# Patient Record
Sex: Female | Born: 1935 | Race: White | Hispanic: No | Marital: Single | Smoking: Never smoker
Health system: Southern US, Community
[De-identification: ages and names within clinical notes are randomized; demographics above are authoritative.]

## PROBLEM LIST (undated history)

## (undated) DIAGNOSIS — F0390 Unspecified dementia without behavioral disturbance: Secondary | ICD-10-CM

## (undated) DIAGNOSIS — C801 Malignant (primary) neoplasm, unspecified: Secondary | ICD-10-CM

## (undated) DIAGNOSIS — E119 Type 2 diabetes mellitus without complications: Secondary | ICD-10-CM

## (undated) DIAGNOSIS — I10 Essential (primary) hypertension: Secondary | ICD-10-CM

## (undated) HISTORY — PX: ABDOMINAL HYSTERECTOMY: SHX81

## (undated) HISTORY — DX: Unspecified dementia, unspecified severity, without behavioral disturbance, psychotic disturbance, mood disturbance, and anxiety: F03.90

---

## 2000-03-14 ENCOUNTER — Inpatient Hospital Stay (HOSPITAL_COMMUNITY): Admission: EM | Admit: 2000-03-14 | Discharge: 2000-03-17 | Payer: Self-pay | Admitting: Internal Medicine

## 2000-03-14 ENCOUNTER — Encounter: Payer: Self-pay | Admitting: Emergency Medicine

## 2000-03-23 ENCOUNTER — Emergency Department (HOSPITAL_COMMUNITY): Admission: EM | Admit: 2000-03-23 | Discharge: 2000-03-23 | Payer: Self-pay | Admitting: Emergency Medicine

## 2001-02-05 ENCOUNTER — Encounter: Admission: RE | Admit: 2001-02-05 | Discharge: 2001-02-05 | Payer: Self-pay | Admitting: Family Medicine

## 2001-02-05 ENCOUNTER — Encounter: Payer: Self-pay | Admitting: Family Medicine

## 2001-02-05 ENCOUNTER — Emergency Department (HOSPITAL_COMMUNITY): Admission: EM | Admit: 2001-02-05 | Discharge: 2001-02-05 | Payer: Self-pay | Admitting: Emergency Medicine

## 2003-06-09 ENCOUNTER — Encounter: Payer: Self-pay | Admitting: Family Medicine

## 2003-06-09 ENCOUNTER — Encounter: Admission: RE | Admit: 2003-06-09 | Discharge: 2003-06-09 | Payer: Self-pay | Admitting: Family Medicine

## 2004-07-19 ENCOUNTER — Ambulatory Visit (HOSPITAL_COMMUNITY): Admission: RE | Admit: 2004-07-19 | Discharge: 2004-07-19 | Payer: Self-pay | Admitting: Family Medicine

## 2005-05-16 ENCOUNTER — Emergency Department (HOSPITAL_COMMUNITY): Admission: EM | Admit: 2005-05-16 | Discharge: 2005-05-16 | Payer: Self-pay | Admitting: Emergency Medicine

## 2009-03-24 ENCOUNTER — Emergency Department (HOSPITAL_COMMUNITY): Admission: EM | Admit: 2009-03-24 | Discharge: 2009-03-25 | Payer: Self-pay | Admitting: Emergency Medicine

## 2009-11-09 ENCOUNTER — Observation Stay (HOSPITAL_COMMUNITY): Admission: EM | Admit: 2009-11-09 | Discharge: 2009-11-09 | Payer: Self-pay | Admitting: Emergency Medicine

## 2010-02-27 ENCOUNTER — Emergency Department (HOSPITAL_COMMUNITY): Admission: EM | Admit: 2010-02-27 | Discharge: 2010-02-27 | Payer: Self-pay | Admitting: Emergency Medicine

## 2011-01-05 LAB — GLUCOSE, CAPILLARY: Glucose-Capillary: 87 mg/dL (ref 70–99)

## 2011-01-05 LAB — POCT I-STAT, CHEM 8
BUN: 36 mg/dL — ABNORMAL HIGH (ref 6–23)
Chloride: 114 mEq/L — ABNORMAL HIGH (ref 96–112)
Creatinine, Ser: 1.5 mg/dL — ABNORMAL HIGH (ref 0.4–1.2)
Potassium: 4 mEq/L (ref 3.5–5.1)
Sodium: 144 mEq/L (ref 135–145)

## 2011-01-07 LAB — URINE CULTURE

## 2011-01-07 LAB — URINALYSIS, ROUTINE W REFLEX MICROSCOPIC
Glucose, UA: NEGATIVE mg/dL
Nitrite: NEGATIVE
Specific Gravity, Urine: 1.017 (ref 1.005–1.030)
pH: 5 (ref 5.0–8.0)

## 2011-01-07 LAB — CBC
HCT: 28.8 % — ABNORMAL LOW (ref 36.0–46.0)
Hemoglobin: 9.7 g/dL — ABNORMAL LOW (ref 12.0–15.0)
Platelets: 188 10*3/uL (ref 150–400)
RDW: 16 % — ABNORMAL HIGH (ref 11.5–15.5)
WBC: 6.3 10*3/uL (ref 4.0–10.5)

## 2011-01-07 LAB — DIFFERENTIAL
Basophils Absolute: 0 10*3/uL (ref 0.0–0.1)
Basophils Relative: 1 % (ref 0–1)
Eosinophils Absolute: 0.1 10*3/uL (ref 0.0–0.7)
Eosinophils Relative: 1 % (ref 0–5)
Monocytes Absolute: 0.4 10*3/uL (ref 0.1–1.0)
Monocytes Relative: 6 % (ref 3–12)
Neutro Abs: 3.6 10*3/uL (ref 1.7–7.7)

## 2011-01-07 LAB — COMPREHENSIVE METABOLIC PANEL
ALT: 11 U/L (ref 0–35)
Albumin: 3.7 g/dL (ref 3.5–5.2)
Alkaline Phosphatase: 45 U/L (ref 39–117)
BUN: 47 mg/dL — ABNORMAL HIGH (ref 6–23)
Chloride: 105 mEq/L (ref 96–112)
Glucose, Bld: 79 mg/dL (ref 70–99)
Potassium: 4.5 mEq/L (ref 3.5–5.1)
Sodium: 136 mEq/L (ref 135–145)
Total Bilirubin: 0.9 mg/dL (ref 0.3–1.2)
Total Protein: 7.5 g/dL (ref 6.0–8.3)

## 2011-01-07 LAB — URINE MICROSCOPIC-ADD ON

## 2011-03-07 NOTE — H&P (Signed)
[place]. [facility]  Patient:    Tricia Dean, Tricia Dean                      MRN: [id number] Adm. Date:  [id number] Attending:  Redmond Baseman             [facility], D. Clovis Riley, M.D.                         History and Physical  PRIMARY CARE PHYSICIAN:  D. Clovis Riley, M.D. at [facility].  CHIEF COMPLAINT:  A 75 year old white female who comes in because of fever, nausea and vomiting, diarrhea and weakness.  HISTORY OF PRESENT ILLNESS:  A 75 year old white female who recently had new medications for diabetes started two weeks ago, then one week ago started with nausea, vomiting, a poor appetite, subsequent diarrhea one week ago, today started with chills and fever.  Temperature just under 104.  She had some mild abdominal pain yesterday earlier, but she does not have her appendix in.  No urinary symptoms, no blood in the stool, no blood in the urine, no blood in the vomitus, no jaundice, no abdominal pain at present.  She was evaluated in the emergency room and found to be dehydrated, hypokalemic, sugar under control and necessitated admission with an elevated white cell count and possible urosepsis.  PAST MEDICAL HISTORY: 1. DM, type 2, for over 10+ years. 2. Hypertension for25+ years. 3. Postmenopausal.  PAST SURGICAL HISTORY:  Hysterectomy, appendectomy remotely.  ALLERGIES:  PENICILLIN and MYCINs.  MEDICATIONS: 1. Tricor 200 mg (discontinued three days ago). 2. Accupril 20 mg q.d. 3. Glyburide 5 mg b.i.d. 4. Actos 30 mg q.d. (which was recently started two weeks ago). 5. Glucophage 850 mg b.i.d.  DIET:  She is to avoid sweets.  She is not on a specific diet.  SOCIAL HISTORY:  Does not smoke, drink.  She is unemployed at present.  She use to work for [employer].  She has two children.  She is married 36+ years.  Her husband was here, one of her daughters.  FAMILY HISTORY:  Mom died at 76 with multiple problems:   asthma, diverticulitis.  Dad died at 70+ years of emphysema.  REVIEW OF SYSTEMS:  She has generalized weakness, no better since the IV started.  She was given a bolus of 10 mEq of KCl, and IV normal saline was started.  Respiratory system:  Negative.  CVS:  Negative.  GI tract:  As above.  GU:  Negative.  CNS:  Generalized weakness.  PHYSICAL EXAMINATION:  GENERAL APPEARANCE:  A white female in no acute distress.  VITAL SIGNS:  Temperature 100.9, blood pressure initially was 109/57 with a heart rate of 114, respiratory rate 22 per minute.  Normal blood pressure was 127/73, heart rate now 80, respiratory rate now 18 per minute when I have seen her, after she has received some IV fluids and potassium.  HEENT:  Anicteric, acyanotic.  Mucous membranes pink and dry.  Pupils are equally reactive to light.  Eyes:  Clear.  Nose:  Clear.  Ears:  Clear.  NECK:  Supple.  LUNGS:  Clear.  CVS:  S1, S2 regular, no murmurs.  ABDOMEN:  Soft, benign.  No liver, kidney, spleen felt.  Bowel sounds normally heard.  PELVIC/RECTAL:  Deferred.  CNS:  Negative, nonfocal.  EXTREMITIES:  No edema.  NEUROLOGIC:  She was awake, alert and conversant.  LABORATORY DATA:  Chest x-ray:  No acute cardiopulmonary disease, question of a minimal left pleural effusion.  EKG:  Normal sinus rhythm with a rate of 93 per minute was normal.  Normal WBC was 17.5 with a left shift, hemoglobin of 10.7, hematocrit 29.9, platelets 355.  Urinalysis:  Protein, positive leukocytes, large bacteria, many glucose, 250 mg/dL, hemoglobin small.  Specific gravity 1.021, pH 5.5.  CMET:  Sodium 131, potassium 2.8, chloride 96, CO2 24, glucose 301, BUN 29, creatinine 1.1, albumin was low at 2.8.  The rest of her liver enzymes normal.  IMPRESSION: 1.  Urosepsis. 2.  Gastroenteritis. 3.  Dehydration. 4.  Controlled diabetes mellitus. 5.  Hypertension. 6.  Postmenopausal. 7.  Hypertriglyceridemia. 8.  Anemia.  PLAN:  1.  Admit.  2. Condition:  Fair.  3. Activities as tolerated.  4. Diet: An 1800 2 g ADA diet.  5. Vital signs q.routine.  6. Diabetic teaching.  7. Nutrition consult.  8. Cipro 400 mg IV q.12h.  9. Accupril 20 mg p.o. q.d. 10. Glucophage.  Switch her diabetic medication to Glucagon 5/500 mg     one p.o. b.i.d. 11. CBGs. 12. Sliding scale Regular Insulin to cover her. 13. IV fluid rehydration normal saline, first L 250 cc per hour then     change to IV D-5 1/2 normal plus 20 mEq of KCl (potassium chloride)     per L at 150 cc an hour. 14. Total three runs of potassium chloride 10 mEq IV boluses. 15. Check her potassium level in four hours.  In the case of less     than 3.0, will give her three more runs of boluses of potassium     chloride. 16. Urine for C&S if not sent. 17. Is and Os. 18. Daily weights. 19. Phenergan IV 12.5 mg q.6h. for nausea and vomiting. 20. Stools, O&P, C&S. 21. Anemia workup. 21. Adjustment to treatment, will amend as necessary. DD:  03/14/00 TD:  03/14/00 Job: [id number]

## 2011-03-07 NOTE — Consult Note (Signed)
[facility]  Patient:    Tricia Dean, Tricia Dean                      MRN: [id number] Adm. Date:  [id number] Disc. Date: [id number] Attending:  Devoria Albe                          Consultation Report  DATE OF BIRTH:  1936-05-01.  HISTORY OF PRESENT ILLNESS:  I had the pleasant to see the patient in the emergency room.  She was kindly referred by Dr. Abran Cantor. Clovis Riley at [facility].  The patient is a 75 years old and she fell down two steps today, no loss of consciousness and complains of right lower extremity pain about her ankle and foot.  She had x-rays at [facility] which show fractures about the lateral malleolus and fifth metatarsal.  The patient denies numbness or tingling in her foot.  She does not have signs or symptoms of compartment syndrome.  She denies pelvis pain, opposite extremity pain or upper extremity pain.  PAST MEDICAL HISTORY:  Diabetes mellitus, controlled with oral hypoglycemics, hypertension, hypercholesterolemia.  PAST SURGICAL HISTORY:  Hysterectomy.  CURRENT MEDICINES:  Three different hypoglycemics, hypertensive medication and aspirin a day.  ALLERGIES:  PENICILLIN and MYCINS.  SOCIAL HISTORY:  No tobacco or ethanol use is noted.  She lives with her husband.  PHYSICAL EXAMINATION:  GENERAL:  Physical examination reveals a white female, alert and oriented, in no acute distress.  EXTREMITIES:  Patient has soft tissue swelling about the right ankle.  She is tender over the lateral malleolus and fifth metatarsal.  She has normal dorsalis pedis pulse, L4 through S1 sensation is intact.  Medial malleolus is minimally tender but no excessive swelling is noted.  There are no signs or symptoms of DVT, compartment syndrome or laceration or abrasion.  Calf is soft.  Knee is stable.  Hips are nontender.  Left lower extremity is atraumatic.  Pelvis is stable.  Her upper extremity examination is benign.  NECK AND BACK:   Nontender.  X-RAY FINDINGS:  X-rays are reviewed which show a displaced Weber A distal fibula fracture.  This does have some cortical apposition noted.  There is no medial malleolus widening or obvious medial malleolus fracture.  The patient has a metaphyseal fifth metatarsal fracture as well but there is no obvious lateral process with a talus fracture.  The Lisfranc joints are intact.  IMPRESSION: 1. Closed lateral malleolus/fibular fracture in a Weber A distribution. 2. Closed metaphyseal fifth metatarsal fracture about the foot.  PLAN:  I have gone ahead and placed her in a nonweightbearing splint, walker was dispensed and I have discussed with the patient, elevation, toe movement, icing and return to clinic in 8 to 10 days for followup.  We will try and treat this non-operatively, given the Weber A distribution and intact mortise. We will monitor this radiographically.  She was given a prescription for Vicodin and Robaxin to be taken as directed and will notify us should any problems, questions or concerns arise.  I have asked her to continue her aspirin each day as instructed.  All questions have been encouraged and answered. DD:  02/05/01 TD:  02/08/01 Job: [id number]

## 2011-03-07 NOTE — Discharge Summary (Signed)
[place]. [facility]  Patient:    Tricia Dean, CASHIN                      MRN: [id number] Adm. Date:  [id number] Disc. Date: [id number] Attending:  Otilio Connors Iv CC:         Dr. Lupe Carney   Discharge Summary  CONSULTATIONS:  None.  PROCEDURES:  None.  HISTORY OF PRESENT ILLNESS:  Patient is a 75 year old type 2 diabetic who one week prior to admission developed nausea, vomiting, anorexia, diarrhea, followed by fever and chills.  Mild abdominal pain was self-limited and she described no urinary symptoms, no melena, and no back pain.  Evaluation in the emergency room revealed dehydration, hypokalemia, and leukocytosis with question of urosepsis and Dr. Leodis Sias has requested we admit patient. For further details of her presentation, please see his dictated report.  HOSPITAL COURSE:  [name] was placed on IV fluids, empiric antibiotics, and blood and urine cultures were submitted.  Blood cultures remained negative, but urine culture showed greater than 100,000 colonies of E. coli sensitive to chosen antibiotics.  Blood sugars were controlled with sliding scale and calorie restrictions.  Her symptoms rapidly improved.  Antibiotic was changed to oral Levaquin, fever resolved, leukocytosis normalized, and appetite returned.  She was discharged in stable condition on Mar 17, 2000 to follow up with her primary care physician.  DISCHARGE MEDICATIONS: 1. Glucophage 850 mg p.o. b.i.d. 2. Glyburide 5 mg p.o. b.i.d. 3. Levaquin 250 mg p.o. q.d. 4. Iron tablets 325 mg p.o. b.i.d. 5. Actos 30 mg p.o. q.d. 6. Accupril 20 mg p.o. q.d.  FOLLOW-UP:  Discharge follow up arranged with Dr. Clovis Riley within one week.  DISCHARGE DIAGNOSES: 1. Pyelonephritis, Escherichia coli with septicemia. 2. Dehydration. 3. Mild prerenal azotemia secondary to the above. 4. Anemia of mixed etiology including iron-deficiency and B12 deficiency    as well as decreased  total iron binding capacity suggesting chronic disease    components.  Discharge hemoglobin 9.3 with mean corpuscular volume reading    2.2, B12 of 219, total iron 16, total iron binding capacity 240, percent    saturation 7. 5. Diabetes mellitus times greater than 10 years. 6. Hypertension times greater than 25 years. 7. Dyslipidemia. 8. Allergy reported to PENICILLIN and MACROLIDES. DD:  07/21/00 TD:  07/21/00 Job: [id number]

## 2012-10-22 ENCOUNTER — Emergency Department (HOSPITAL_COMMUNITY): Payer: Medicare Other

## 2012-10-22 ENCOUNTER — Encounter (HOSPITAL_COMMUNITY): Payer: Self-pay | Admitting: *Deleted

## 2012-10-22 ENCOUNTER — Observation Stay (HOSPITAL_COMMUNITY)
Admission: EM | Admit: 2012-10-22 | Discharge: 2012-10-24 | Disposition: A | Discharge status: Home / Self Care | Payer: Medicare Other | Attending: Internal Medicine | Admitting: Internal Medicine

## 2012-10-22 DIAGNOSIS — R55 Syncope and collapse: Principal | ICD-10-CM | POA: Diagnosis present

## 2012-10-22 DIAGNOSIS — E119 Type 2 diabetes mellitus without complications: Secondary | ICD-10-CM

## 2012-10-22 DIAGNOSIS — E860 Dehydration: Secondary | ICD-10-CM | POA: Insufficient documentation

## 2012-10-22 DIAGNOSIS — N289 Disorder of kidney and ureter, unspecified: Secondary | ICD-10-CM

## 2012-10-22 DIAGNOSIS — R112 Nausea with vomiting, unspecified: Secondary | ICD-10-CM | POA: Insufficient documentation

## 2012-10-22 DIAGNOSIS — N179 Acute kidney failure, unspecified: Secondary | ICD-10-CM | POA: Insufficient documentation

## 2012-10-22 DIAGNOSIS — I10 Essential (primary) hypertension: Secondary | ICD-10-CM

## 2012-10-22 DIAGNOSIS — R404 Transient alteration of awareness: Secondary | ICD-10-CM | POA: Insufficient documentation

## 2012-10-22 HISTORY — DX: Essential (primary) hypertension: I10

## 2012-10-22 HISTORY — DX: Type 2 diabetes mellitus without complications: E11.9

## 2012-10-22 LAB — CBC WITH DIFFERENTIAL/PLATELET
Basophils Absolute: 0 10*3/uL (ref 0.0–0.1)
Basophils Relative: 0 % (ref 0–1)
Eosinophils Absolute: 0 10*3/uL (ref 0.0–0.7)
Hemoglobin: 10.4 g/dL — ABNORMAL LOW (ref 12.0–15.0)
MCH: 28.7 pg (ref 26.0–34.0)
MCHC: 32.8 g/dL (ref 30.0–36.0)
Monocytes Relative: 5 % (ref 3–12)
Neutro Abs: 5.1 10*3/uL (ref 1.7–7.7)
Neutrophils Relative %: 75 % (ref 43–77)
Platelets: 231 10*3/uL (ref 150–400)
RDW: 12.7 % (ref 11.5–15.5)

## 2012-10-22 MED ORDER — ONDANSETRON HCL 4 MG/2ML IJ SOLN
4.0000 mg | Freq: Once | INTRAMUSCULAR | Status: AC
  Administered 2012-10-22: 4 mg via INTRAVENOUS
  Filled 2012-10-22: qty 2

## 2012-10-22 MED ORDER — SODIUM CHLORIDE 0.9 % IV SOLN
Freq: Once | INTRAVENOUS | Status: AC
  Administered 2012-10-22: 23:00:00 via INTRAVENOUS

## 2012-10-22 NOTE — ED Notes (Addendum)
Per family pt has been having nausea and vomiting for 7 days.  Pt has been taking bactrim and another antibiotic for a abscess on her chin and upper lip.  Family reports that pt was seated at home and started having a "shaking spell" and the had a minute long loss of consciousness.  EMS gave 4 mg of Zofran that seemed to help initially, but pt vomited upon being moved from ambulance to hospital.  Per EMS pt orthostatics as follows:  Sitting 130/94 HR 72, Standing 124/92 HR 72

## 2012-10-23 ENCOUNTER — Encounter (HOSPITAL_COMMUNITY): Payer: Self-pay | Admitting: Family Medicine

## 2012-10-23 DIAGNOSIS — E860 Dehydration: Secondary | ICD-10-CM

## 2012-10-23 DIAGNOSIS — E119 Type 2 diabetes mellitus without complications: Secondary | ICD-10-CM

## 2012-10-23 DIAGNOSIS — R55 Syncope and collapse: Secondary | ICD-10-CM | POA: Diagnosis present

## 2012-10-23 DIAGNOSIS — I10 Essential (primary) hypertension: Secondary | ICD-10-CM

## 2012-10-23 DIAGNOSIS — R6889 Other general symptoms and signs: Secondary | ICD-10-CM

## 2012-10-23 LAB — CBC
MCV: 87.9 fL (ref 78.0–100.0)
Platelets: 227 10*3/uL (ref 150–400)
Platelets: 241 10*3/uL (ref 150–400)
RBC: 3.46 MIL/uL — ABNORMAL LOW (ref 3.87–5.11)
RBC: 3.72 MIL/uL — ABNORMAL LOW (ref 3.87–5.11)
RDW: 12.9 % (ref 11.5–15.5)
RDW: 12.8 % (ref 11.5–15.5)
WBC: 7.5 10*3/uL (ref 4.0–10.5)
WBC: 8.3 10*3/uL (ref 4.0–10.5)

## 2012-10-23 LAB — URINALYSIS, ROUTINE W REFLEX MICROSCOPIC
Leukocytes, UA: NEGATIVE
Nitrite: NEGATIVE
Protein, ur: NEGATIVE mg/dL
Specific Gravity, Urine: 1.019 (ref 1.005–1.030)
Urobilinogen, UA: 1 mg/dL (ref 0.0–1.0)

## 2012-10-23 LAB — COMPREHENSIVE METABOLIC PANEL
AST: 17 U/L (ref 0–37)
Albumin: 3.4 g/dL — ABNORMAL LOW (ref 3.5–5.2)
Alkaline Phosphatase: 56 U/L (ref 39–117)
BUN: 53 mg/dL — ABNORMAL HIGH (ref 6–23)
Chloride: 100 mEq/L (ref 96–112)
Potassium: 3.5 mEq/L (ref 3.5–5.1)
Sodium: 137 mEq/L (ref 135–145)
Total Bilirubin: 0.3 mg/dL (ref 0.3–1.2)
Total Protein: 7.2 g/dL (ref 6.0–8.3)

## 2012-10-23 LAB — BASIC METABOLIC PANEL
CO2: 22 mEq/L (ref 19–32)
Calcium: 9.2 mg/dL (ref 8.4–10.5)
Creatinine, Ser: 2.27 mg/dL — ABNORMAL HIGH (ref 0.50–1.10)
GFR calc Af Amer: 23 mL/min — ABNORMAL LOW (ref >90)
GFR calc non Af Amer: 20 mL/min — ABNORMAL LOW (ref >90)
Sodium: 141 mEq/L (ref 135–145)

## 2012-10-23 LAB — CREATININE, SERUM
Creatinine, Ser: 2.31 mg/dL — ABNORMAL HIGH (ref 0.50–1.10)
GFR calc Af Amer: 23 mL/min — ABNORMAL LOW (ref >90)

## 2012-10-23 LAB — GLUCOSE, CAPILLARY: Glucose-Capillary: 84 mg/dL (ref 70–99)

## 2012-10-23 LAB — LIPASE, BLOOD: Lipase: 16 U/L (ref 11–59)

## 2012-10-23 LAB — MAGNESIUM: Magnesium: 1.7 mg/dL (ref 1.5–2.5)

## 2012-10-23 MED ORDER — ONDANSETRON HCL 4 MG/2ML IJ SOLN
4.0000 mg | Freq: Four times a day (QID) | INTRAMUSCULAR | Status: DC | PRN
  Administered 2012-10-23: 4 mg via INTRAVENOUS
  Filled 2012-10-23: qty 2

## 2012-10-23 MED ORDER — CLONIDINE HCL 0.1 MG PO TABS
0.1000 mg | ORAL_TABLET | Freq: Four times a day (QID) | ORAL | Status: DC | PRN
  Filled 2012-10-23: qty 1

## 2012-10-23 MED ORDER — ALUM & MAG HYDROXIDE-SIMETH 200-200-20 MG/5ML PO SUSP: 30.0000 mL | Freq: Four times a day (QID) | ORAL | Status: DC | PRN

## 2012-10-23 MED ORDER — SODIUM CHLORIDE 0.9 % IV SOLN
INTRAVENOUS | Status: DC
  Administered 2012-10-23 – 2012-10-24 (×3): via INTRAVENOUS

## 2012-10-23 MED ORDER — VANCOMYCIN HCL 1000 MG IV SOLR
750.0000 mg | INTRAVENOUS | Status: DC
  Administered 2012-10-23: 750 mg via INTRAVENOUS
  Filled 2012-10-23 (×2): qty 750

## 2012-10-23 MED ORDER — HEPARIN SODIUM (PORCINE) 5000 UNIT/ML IJ SOLN
5000.0000 [IU] | Freq: Three times a day (TID) | INTRAMUSCULAR | Status: DC
  Administered 2012-10-23 – 2012-10-24 (×4): 5000 [IU] via SUBCUTANEOUS
  Filled 2012-10-23 (×7): qty 1

## 2012-10-23 MED ORDER — INSULIN ASPART 100 UNIT/ML ~~LOC~~ SOLN
0.0000 [IU] | Freq: Three times a day (TID) | SUBCUTANEOUS | Status: DC
  Administered 2012-10-24: 1 [IU] via SUBCUTANEOUS

## 2012-10-23 MED ORDER — DONEPEZIL HCL 10 MG PO TABS
10.0000 mg | ORAL_TABLET | Freq: Every day | ORAL | Status: DC
  Administered 2012-10-23: 10 mg via ORAL
  Filled 2012-10-23 (×2): qty 1

## 2012-10-23 MED ORDER — PROMETHAZINE HCL 25 MG/ML IJ SOLN: 12.5000 mg | Freq: Four times a day (QID) | INTRAMUSCULAR | Status: DC | PRN

## 2012-10-23 MED ORDER — SODIUM CHLORIDE 0.9 % IV BOLUS (SEPSIS)
1000.0000 mL | Freq: Once | INTRAVENOUS | Status: AC
  Administered 2012-10-23: 1000 mL via INTRAVENOUS

## 2012-10-23 MED ORDER — ENSURE COMPLETE PO LIQD
237.0000 mL | Freq: Two times a day (BID) | ORAL | Status: DC
  Administered 2012-10-24 (×2): 237 mL via ORAL

## 2012-10-23 MED ORDER — ACETAMINOPHEN 325 MG PO TABS: 650.0000 mg | ORAL_TABLET | Freq: Four times a day (QID) | ORAL | Status: DC | PRN

## 2012-10-23 MED ORDER — ACETAMINOPHEN 650 MG RE SUPP: 650.0000 mg | Freq: Four times a day (QID) | RECTAL | Status: DC | PRN

## 2012-10-23 MED ORDER — ONDANSETRON HCL 4 MG PO TABS: 4.0000 mg | ORAL_TABLET | Freq: Four times a day (QID) | ORAL | Status: DC | PRN

## 2012-10-23 MED ORDER — SENNOSIDES-DOCUSATE SODIUM 8.6-50 MG PO TABS: 1.0000 | ORAL_TABLET | Freq: Every evening | ORAL | Status: DC | PRN

## 2012-10-23 MED ORDER — MAGNESIUM SULFATE 40 MG/ML IJ SOLN
2.0000 g | Freq: Once | INTRAMUSCULAR | Status: AC
  Administered 2012-10-23: 2 g via INTRAVENOUS
  Filled 2012-10-23: qty 50

## 2012-10-23 MED ORDER — PROMETHAZINE HCL 25 MG/ML IJ SOLN
12.5000 mg | Freq: Once | INTRAMUSCULAR | Status: AC
  Administered 2012-10-23: 12.5 mg via INTRAVENOUS
  Filled 2012-10-23: qty 1

## 2012-10-23 NOTE — Progress Notes (Signed)
ANTIBIOTIC CONSULT NOTE - INITIAL  Pharmacy Consult for vancomycin Indication: MRSA lip abscess  Allergies  Allergen Reactions  . Other     "miacin"?  . Penicillins     Patient Measurements: Height: 5\' 2"  (157.5 cm) Weight: 121 lb 3.2 oz (54.976 kg) IBW/kg (Calculated) : 50.1   Vital Signs: Temp: 98.2 F (36.8 C) (01/04 0529) Temp src: Oral (01/04 0529) BP: 137/33 mmHg (01/04 0529) Pulse Rate: 80  (01/04 0529)  Labs:  Basename 10/22/12 2311  WBC 6.8  HGB 10.4*  PLT 231  LABCREA --  CREATININE 2.58*   Estimated Creatinine Clearance: 15.1 ml/min (by C-G formula based on Cr of 2.58).   Microbiology: No results found for this or any previous visit (from the past 720 hour(s)).  Medical History: Past Medical History  Diagnosis Date  . Hypertension   . Diabetes mellitus without complication     Medications:  Prescriptions prior to admission  Medication Sig Dispense Refill  . donepezil (ARICEPT) 10 MG tablet Take 10 mg by mouth at bedtime.      Marland Kitchen levofloxacin (LEVAQUIN) 750 MG tablet Take 750 mg by mouth daily. Filled 12.31.13 for 5 days ending 1.5.14      . promethazine (PHENERGAN) 25 MG tablet Take 25 mg by mouth every 6 (six) hours as needed. For nausea      . quinapril (ACCUPRIL) 40 MG tablet Take 40 mg by mouth daily.      Marland Kitchen sulfamethoxazole-trimethoprim (BACTRIM DS) 800-160 MG per tablet Take 1 tablet by mouth 2 (two) times daily. Filled 12.29.13 for 10 days ending 1.9.14       Scheduled:    . [COMPLETED] sodium chloride   Intravenous Once  . donepezil  10 mg Oral QHS  . heparin  5,000 Units Subcutaneous Q8H  . [COMPLETED] magnesium sulfate  2 g Intravenous Once  . [COMPLETED] ondansetron  4 mg Intravenous Once  . [COMPLETED] promethazine  12.5 mg Intravenous Once  . [COMPLETED] sodium chloride  1,000 mL Intravenous Once  . vancomycin  750 mg Intravenous Q48H    Assessment: 77yo female developed abscess of lip on Monday, urgent care lanced and started  2 ABX inc Bactrim, worsened on Tuesday, PCP lanced again and changed ABX to Bactrim and Levaquin, lesion appeared to improve on these ABX though pt began experiencing syncope/LOC and N/V,  PCP later informed pt that Cx revealed MRSA, now being admitted with acute on CRI and dehydration, to begin IV ABX.  Goal of Therapy:  Vancomycin trough level 15-20 mcg/ml (will decrease if broader infxn r/o)  Plan:  Will begin vancomycin 750mg  IV Q48H and monitor CBC, Cx, CrCl, levels prn.  Colleen Can PharmD BCPS 10/23/2012,5:36 AM

## 2012-10-23 NOTE — Progress Notes (Signed)
[facility] PROGRESS NOTE  Tricia Dean WJX:[id number] DOB: Sep 27, 1936 DOA: 10/22/2012 PCP: Benita Stabile, MD  Assessment/Plan  Acute on chronic kidney injury, creatinine starting to trend down with hydration -  Continue hydration -  Continue hold ACEI -  Repeat BMP in a.m.  MRSA lip abscess, improving -  Continue monotherapy vancomycin  Nausea and vomiting, persistent.  May have been due to oral antibiotics which were discontinued.  Patient unable to eat breakfast this morning. -  Clear liquid diet -  Continue antiemetics -  Urinalysis -  LFTs wnl.  Add lipase  Hypertension, blood pressure stable.  Holding ACEI due to AKI.   -  Continue Clonidine prn  Diabetes mellitus diet controlled at home.  ADA diet, sliding scale insulin   Diet:  CLD Access:  PIV IVF:  NS at 20ml/h Proph:  heparin  Code Status: full code Family Communication: spoke with patient alone  Disposition Plan: pending able to tolerate diet, improvement in kidney function   Consultants:  none  Procedures:  KUB  Antibiotics:  Vancomycin 1/4  HPI/Subjective:  The patient states that she continues to have nausea and vomited overnight.  She could not eat her breakfast due to nausea.  Denies chest pain, shortness of breath, constipation, diarrhea, fevers, and chills.  Lip continues to improve.    Objective: Filed Vitals:   10/23/12 0200 10/23/12 0400 10/23/12 0529 10/23/12 0957  BP: 129/46 121/39 137/33 127/38  Pulse: 70 70 80 69  Temp:   98.2 F (36.8 C) 97.7 F (36.5 C)  TempSrc:   Oral Oral  Resp: 20 21 20 20   Height:   5\' 2"  (1.575 m)   Weight:   54.976 kg (121 lb 3.2 oz)   SpO2: 98% 98% 100% 100%    Intake/Output Summary (Last 24 hours) at 10/23/12 1220 Last data filed at 10/23/12 0900  Gross per 24 hour  Intake     60 ml  Output    250 ml  Net   -190 ml   Filed Weights   10/23/12 0529  Weight: 54.976 kg (121 lb 3.2 oz)    Exam:   General:  CF, no acute  distress  HEENT:  Right lower lip mildly indurated and swollen, but no erythema  Cardiovascular:  RRR, no mrg, 2+ pulses  Respiratory:  CTAB  Abdomen:  NABS, soft, nondistended, nontender, no organomegaly  MSK:  Normal tone and bulk  Neuro:  Grossly intact  Data Reviewed: Basic Metabolic Panel:  Lab 10/23/12 2130 10/23/12 0710 10/22/12 2316 10/22/12 2311  NA 141 -- -- 137  K 3.8 -- -- 3.5  CL 104 -- -- 100  CO2 22 -- -- 19  GLUCOSE 89 -- -- 125*  BUN 48* -- -- 53*  CREATININE 2.27* 2.31* -- 2.58*  CALCIUM 9.2 -- -- 9.7  MG -- -- 1.7 --  PHOS -- -- -- --   Liver Function Tests:  Lab 10/22/12 2311  AST 17  ALT 7  ALKPHOS 56  BILITOT 0.3  PROT 7.2  ALBUMIN 3.4*   No results found for this basename: LIPASE:5,AMYLASE:5 in the last 168 hours No results found for this basename: AMMONIA:5 in the last 168 hours CBC:  Lab 10/23/12 0956 10/23/12 0710 10/22/12 2311  WBC 8.3 7.5 6.8  NEUTROABS -- -- 5.1  HGB 10.8* 10.3* 10.4*  HCT 32.7* 30.3* 31.7*  MCV 87.9 87.6 87.3  PLT 241 227 231   Cardiac Enzymes: No results found for this basename:  CKTOTAL:5,CKMB:5,CKMBINDEX:5,TROPONINI:5 in the last 168 hours BNP (last 3 results) No results found for this basename: PROBNP:3 in the last 8760 hours CBG: No results found for this basename: GLUCAP:5 in the last 168 hours  No results found for this or any previous visit (from the past 240 hour(s)).   Studies: Dg Abd Acute W/chest  10/23/2012  *RADIOLOGY REPORT*  Clinical Data: Vomiting, loss of consciousness  ACUTE ABDOMEN SERIES (ABDOMEN 2 VIEW & CHEST 1 VIEW)  Comparison: None  Findings: Upper-normal size of cardiac silhouette. Atherosclerotic calcification aorta. Pulmonary vascularity normal. Peribronchial thickening without infiltrate or effusion. No pneumothorax. Paucity of bowel gas. Increased stool in rectum. Surgical clips in pelvis. No bowel dilatation, bowel wall thickening or free intraperitoneal air. Diffuse osseous  demineralization. No definite urinary tract calcification.  IMPRESSION: Bronchitic changes. No definite acute abdominal findings.   Original Report Authenticated By: Ulyses Southward, M.D.     Scheduled Meds:   . donepezil  10 mg Oral QHS  . heparin  5,000 Units Subcutaneous Q8H  . vancomycin  750 mg Intravenous Q48H   Continuous Infusions:   . sodium chloride 75 mL/hr at 10/23/12 0541    Active Problems:  Syncope  Hypertension  Diabetes mellitus    Time spent: 30 min    Breasia Karges, [facility]  [facility] Pager [phone]. If 8PM-8AM, please contact night-coverage at [email or web address], password [facility] 10/23/2012, 12:20 PM  LOS: 1 day

## 2012-10-23 NOTE — H&P (Signed)
PCP:   Benita Stabile, MD   Chief Complaint:  Syncope  HPI: This is a 77 year old female who developed an abscess on her right lip on Monday. She went to urgent care where she was prescribed 2 antibiotics including Bactrim and her lip was I&D. By Tuesday her lip was much more swollen, she saw her PCP who again I&D this time the inside of her lip, he continued Bactrim and added Levaquin, the other second antibiotic was discontinued. She was since in form she has a MRSA infection. Since she has been on Bactrim and Levaquin the swelling on her mouth has significantly improved, however, she's developed nausea since Tuesday and started with vomiting once or twice a day since yesterday. Her liquid intake has been decreased. Today while walking around the house she began not to feel well, she developed shakes and eventually syncopized. Family members were close, she did not fall but she did have loss of consciousness for approximately a minute. She was confused approximately 5 minutes after. They brought her to the ER. History provided by the patient as well as multiple family members at bedside.  Review of Systems:  The patient denies anorexia, fever, weight loss,, vision loss, decreased hearing, hoarseness, chest pain, dyspnea on exertion, peripheral edema, balance deficits, hemoptysis, abdominal pain, melena, hematochezia, severe indigestion/heartburn, hematuria, incontinence, genital sores, muscle weakness, suspicious skin lesions, transient blindness, difficulty walking, depression, unusual weight change, abnormal bleeding, enlarged lymph nodes, angioedema, and breast masses.  Past Medical History: Past Medical History  Diagnosis Date  . Hypertension   . Diabetes mellitus without complication    Past Surgical History  Procedure Date  . Abdominal hysterectomy     Medications: Prior to Admission medications   Medication Sig Start Date End Date Taking? Authorizing Provider  donepezil  (ARICEPT) 10 MG tablet Take 10 mg by mouth at bedtime.   Yes Historical Provider, MD  levofloxacin (LEVAQUIN) 750 MG tablet Take 750 mg by mouth daily. Filled 12.31.13 for 5 days ending 1.5.14   Yes Historical Provider, MD  promethazine (PHENERGAN) 25 MG tablet Take 25 mg by mouth every 6 (six) hours as needed. For nausea   Yes Historical Provider, MD  quinapril (ACCUPRIL) 40 MG tablet Take 40 mg by mouth daily.   Yes Historical Provider, MD  sulfamethoxazole-trimethoprim (BACTRIM DS) 800-160 MG per tablet Take 1 tablet by mouth 2 (two) times daily. Filled 12.29.13 for 10 days ending 1.9.14   Yes Historical Provider, MD    Allergies:   Allergies  Allergen Reactions  . Other     "miacin"?  . Penicillins     Social History:  reports that she has never smoked. She does not have any smokeless tobacco history on file. She reports that she does not drink alcohol or use illicit drugs.  Family History: Hypertension  Physical Exam: Filed Vitals:   10/22/12 2315 10/22/12 2330 10/23/12 0133 10/23/12 0200  BP: 111/98 133/53 129/41 129/46  Pulse: 63 64 75 70  Temp:   98 F (36.7 C)   TempSrc:   Oral   Resp: 19 18 18 20   SpO2: 100% 100% 100% 98%    General:  Alert and oriented times three, well developed and nourished, no acute distress Eyes: PERRLA, pink conjunctiva, no scleral icterus ENT: Moist oral mucosa, neck supple, no thyromegaly Lungs: clear to ascultation, no wheeze, no crackles, no use of accessory muscles Cardiovascular: regular rate and rhythm, no regurgitation, no gallops, no murmurs. No carotid bruits, no JVD Abdomen: soft,  positive BS, non-tender, non-distended, no organomegaly, not an acute abdomen GU: not examined Neuro: CN II - XII grossly intact, sensation intact Musculoskeletal: strength 5/5 all extremities, no clubbing, cyanosis or edema Skin: no rash, no subcutaneous crepitation, no decubitus Psych: appropriate patient   Labs on Admission:   [facility] 10/22/12  2316 10/22/12 2311  NA -- 137  K -- 3.5  CL -- 100  CO2 -- 19  GLUCOSE -- 125*  BUN -- 53*  CREATININE -- 2.58*  CALCIUM -- 9.7  MG 1.7 --  PHOS -- --    Basename 10/22/12 2311  AST 17  ALT 7  ALKPHOS 56  BILITOT 0.3  PROT 7.2  ALBUMIN 3.4*   No results found for this basename: LIPASE:2,AMYLASE:2 in the last 72 hours  Basename 10/22/12 2311  WBC 6.8  NEUTROABS 5.1  HGB 10.4*  HCT 31.7*  MCV 87.3  PLT 231    Micro Results: No results found for this or any previous visit (from the past 240 hour(s)).   Radiological Exams on Admission: Dg Abd Acute W/chest  10/23/2012  *RADIOLOGY REPORT*  Clinical Data: Vomiting, loss of consciousness  ACUTE ABDOMEN SERIES (ABDOMEN 2 VIEW & CHEST 1 VIEW)  Comparison: None  Findings: Upper-normal size of cardiac silhouette. Atherosclerotic calcification aorta. Pulmonary vascularity normal. Peribronchial thickening without infiltrate or effusion. No pneumothorax. Paucity of bowel gas. Increased stool in rectum. Surgical clips in pelvis. No bowel dilatation, bowel wall thickening or free intraperitoneal air. Diffuse osseous demineralization. No definite urinary tract calcification.  IMPRESSION: Bronchitic changes. No definite acute abdominal findings.   Original Report Authenticated By: Ulyses Southward, M.D.    EKG: Normal sinus rhythm, prolonged QTC  Assessment/Plan Present on Admission:  . Syncope Dehydration Acute on chronic kidney injury Bring in for 23 hour observation Discontinue Bactrim, Levaquin, Accupril IV fluid hydration Repeat BMP in a.m., monitor MRSA lip abscess Much improved  Patient has completed 4 days of antibiotics. We'll do vancomycin pharmacist to dose  Nausea and vomiting Most likely due to antibiotics. Antiemetics ordered as needed  Urinalysis ordered, collection pending Hypertension Diabetes mellitus ADA diet, sliding scale insulin Clonidine when necessary as ACE inhibitors discontinued  Full code DVT  prophylaxis   Hudson Lehmkuhl 10/23/2012, 3:59 AM

## 2012-10-23 NOTE — Progress Notes (Signed)
INITIAL NUTRITION ASSESSMENT  DOCUMENTATION CODES Per approved criteria  -Not Applicable   INTERVENTION:  Ensure Complete po BID, each supplement provides 350 kcal and 13 grams of protein.  NUTRITION DIAGNOSIS: Inadequate oral intake related to poor appetite with nausea and vomiting as evidenced by poor intake of meals.   Goal: Intake to meet >90% of estimated nutrition needs.  Monitor:  PO intake, weight trend, labs.  Reason for Assessment: MST=3  77 y.o. female  Admitting Dx: Syncope  ASSESSMENT: Patient reports good PO intake PTA, but has been a little sick since admission and has not been eating well today.  Diet just advanced to Full Liquids.  Patient denies any recent weight loss.  Height: Ht Readings from Last 1 Encounters:  10/23/12 5\' 2"  (1.575 m)    Weight: Wt Readings from Last 1 Encounters:  10/23/12 121 lb 3.2 oz (54.976 kg)    Ideal Body Weight: 50 kg  % Ideal Body Weight: 110%  Wt Readings from Last 10 Encounters:  10/23/12 121 lb 3.2 oz (54.976 kg)    Usual Body Weight: 120's  % Usual Body Weight: 100%  BMI:  Body mass index is 22.17 kg/(m^2).  Estimated Nutritional Needs: Kcal: 1200-1400 Protein: 60-70 gm Fluid: 1.2 - 1.4 L  Skin: no problems noted  Diet Order: Full Liquid  EDUCATION NEEDS: -No education needs identified at this time   Intake/Output Summary (Last 24 hours) at 10/23/12 1601 Last data filed at 10/23/12 1500  Gross per 24 hour  Intake    180 ml  Output    500 ml  Net   -320 ml    Last BM: 1/2   Labs:   Lab 10/23/12 0956 10/23/12 0710 10/22/12 2316 10/22/12 2311  NA 141 -- -- 137  K 3.8 -- -- 3.5  CL 104 -- -- 100  CO2 22 -- -- 19  BUN 48* -- -- 53*  CREATININE 2.27* 2.31* -- 2.58*  CALCIUM 9.2 -- -- 9.7  MG -- -- 1.7 --  PHOS -- -- -- --  GLUCOSE 89 -- -- 125*    CBG (last 3)  No results found for this basename: GLUCAP:3 in the last 72 hours  Scheduled Meds:   . donepezil  10 mg Oral QHS  .  heparin  5,000 Units Subcutaneous Q8H  . insulin aspart  0-9 Units Subcutaneous TID WC  . vancomycin  750 mg Intravenous Q48H    Continuous Infusions:   . sodium chloride 75 mL/hr at 10/23/12 0541    Past Medical History  Diagnosis Date  . Hypertension   . Diabetes mellitus without complication     Past Surgical History  Procedure Date  . Abdominal hysterectomy     Joaquin Courts, RD, LDN, CNSC Pager# [phone] After Hours Pager# [phone]

## 2012-10-23 NOTE — ED Provider Notes (Signed)
History     CSN: [id number]  Arrival date & time 10/22/12  2226   First MD Initiated Contact with Patient 10/22/12 2302      Chief Complaint  Patient presents with  . Nausea  . Emesis  . Loss of Consciousness    (Consider location/radiation/quality/duration/timing/severity/associated sxs/prior treatment) HPI 77 year old female presents to emergency department via EMS from home with complaint of loss of consciousness. Family reports patient developed an abscess to her right lower lip and chin earlier in the week, was seen at urgent care on Sunday, had the abscess lanced and placed on 2 antibiotics (bactrim and unknown other abx) She was seen by her primary care Dr. Dr. Clovis Riley the next day, with worsening swelling. The abscess was lanced again, and she was changed to Levaquin and Bactrim. Patient has had intermittent vomiting since onset of the abscess on Sunday. Patient was feeling much better today, ate better and was more mobile. This evening, however patient began to have shaking and feeling unwell. Patient complained of generalized weakness and aked to be helped back to bed.  On the way there she had episode of syncope. Family reports she was out for 1-2 minutes. She has one prior episode of syncope about 2 years ago when she got overheated. Patient has history of diabetes and hypertension. She's not currently on medications for her diabetes. Family has been giving her Phenergan for nausea. No diarrhea, fevers, sick contact,s travel, or unusual foods. Patient reports she had a bowel movement yesterday which was normal.  She denies any black stools. Reports dark brown to black emesis. No prior history of GI bleed or ulcer.  Past Medical History  Diagnosis Date  . Hypertension   . Diabetes mellitus without complication     Past Surgical History  Procedure Date  . Abdominal hysterectomy     No family history on file.  History  Substance Use Topics  . Smoking status: Never Smoker     . Smokeless tobacco: Not on file  . Alcohol Use: No    OB History    Grav Para Term Preterm Abortions TAB SAB Ect Mult Living                  Review of Systems  See History of Present Illness; otherwise all other systems are reviewed and negative  Allergies  Other and Penicillins  Home Medications   Current Outpatient Rx  Name  Route  Sig  Dispense  Refill  . DONEPEZIL HCL 10 MG PO TABS   Oral   Take 10 mg by mouth at bedtime.         Marland Kitchen LEVOFLOXACIN 750 MG PO TABS   Oral   Take 750 mg by mouth daily. Filled 12.31.13 for 5 days ending 1.5.14         . PROMETHAZINE HCL 25 MG PO TABS   Oral   Take 25 mg by mouth every 6 (six) hours as needed. For nausea         . QUINAPRIL HCL 40 MG PO TABS   Oral   Take 40 mg by mouth daily.         . SULFAMETHOXAZOLE-TMP DS 800-160 MG PO TABS   Oral   Take 1 tablet by mouth 2 (two) times daily. Filled 12.29.13 for 10 days ending 1.9.14           BP 111/98  Pulse 63  Temp 97.9 F (36.6 C) (Oral)  Resp 19  SpO2 100%  Physical Exam  Nursing note and vitals reviewed. Constitutional: She appears well-developed and well-nourished.  HENT:  Head: Normocephalic and atraumatic.  Right Ear: External ear normal.  Left Ear: External ear normal.  Nose: Nose normal.       Dry mucous membranes. Ulceration noted to right inner lip without fluctuance induration or drainage.  Eyes: Conjunctivae normal and EOM are normal. Pupils are equal, round, and reactive to light.  Neck: Normal range of motion. Neck supple. No JVD present. No tracheal deviation present. No thyromegaly present.  Cardiovascular: Normal rate, regular rhythm, normal heart sounds and intact distal pulses.  Exam reveals no gallop and no friction rub.   No murmur heard. Pulmonary/Chest: Effort normal and breath sounds normal. No stridor. No respiratory distress. She has no wheezes. She has no rales. She exhibits no tenderness.  Abdominal: Soft. Bowel sounds are  normal. She exhibits no distension and no mass. There is no tenderness. There is no rebound and no guarding.  Musculoskeletal: Normal range of motion. She exhibits no edema and no tenderness.  Lymphadenopathy:    She has no cervical adenopathy.  Neurological: She is alert. She exhibits normal muscle tone. Coordination normal.  Skin: Skin is warm and dry. No rash noted. There is erythema. No pallor.       Mild erythema to right lower lip  Psychiatric: She has a normal mood and affect. Her behavior is normal. Judgment and thought content normal.    ED Course  Procedures (including critical care time)  Labs Reviewed  CBC WITH DIFFERENTIAL - Abnormal; Notable for the following:    RBC 3.63 (*)     Hemoglobin 10.4 (*)     HCT 31.7 (*)     All other components within normal limits  COMPREHENSIVE METABOLIC PANEL - Abnormal; Notable for the following:    Glucose, Bld 125 (*)     BUN 53 (*)     Creatinine, Ser 2.58 (*)     Albumin 3.4 (*)     GFR calc non Af Amer 17 (*)     GFR calc Af Amer 20 (*)     All other components within normal limits  OCCULT BLOOD, POC DEVICE - Abnormal; Notable for the following:    Fecal Occult Bld POSITIVE (*)     All other components within normal limits  MAGNESIUM  URINALYSIS, ROUTINE W REFLEX MICROSCOPIC  OCCULT BLOOD GASTRIC / DUODENUM   Dg Abd Acute W/chest  10/23/2012  *RADIOLOGY REPORT*  Clinical Data: Vomiting, loss of consciousness  ACUTE ABDOMEN SERIES (ABDOMEN 2 VIEW & CHEST 1 VIEW)  Comparison: None  Findings: Upper-normal size of cardiac silhouette. Atherosclerotic calcification aorta. Pulmonary vascularity normal. Peribronchial thickening without infiltrate or effusion. No pneumothorax. Paucity of bowel gas. Increased stool in rectum. Surgical clips in pelvis. No bowel dilatation, bowel wall thickening or free intraperitoneal air. Diffuse osseous demineralization. No definite urinary tract calcification.  IMPRESSION: Bronchitic changes. No definite  acute abdominal findings.   Original Report Authenticated By: Ulyses Southward, M.D.     Date: 10/23/2012  Rate: 66  Rhythm: sinus tachycardia  QRS Axis: normal  Intervals: normal  ST/T Wave abnormalities: nonspecific T wave changes  Conduction Disutrbances:none  Narrative Interpretation:   Old EKG Reviewed: unchanged    1. Dehydration   2. Renal insufficiency   3. Syncope   4. Nausea and vomiting       MDM  77 year old female with episode of syncope. Clinically she appears dehydrated. The area of abscess looks  like it is healing well. Patient does not appear septic. Patient has been on Levaquin concern for may be QT prolongation. Will check magnesium as well as EKG. Concern for possible gastritis given the appearance of her emesis and persistent vomiting. She's been on several antibiotics this week. We'll get baseline labs give IV fluids and reassess.   12:49 AM Patient with gastric occult positive, she is not significantly anemic. She does have signs of dehydration with elevated BUN and creatinine. Her magnesium is slightly low.       Olivia Mackie, MD 10/23/12 [id number]

## 2012-10-23 NOTE — ED Notes (Addendum)
Pt c/o continued nausea (dry heaves), phenergan ordered per Dr. Norlene Campbell EDP, admitting MD Dr. Haroldine Laws at [facility]. Family at [facility] x2. Pt alert, NAD, calm, interactive, resps e/u, speaking in clear complete sentences. IVF bolus continues.

## 2012-10-23 NOTE — ED Notes (Signed)
Patient assisted to the restroom and provided a collection hat to obtain a specimen. Patient was able to void, however the specimen missed the collection hat. Will attempt to collect a specimen when patient needs to void next.

## 2012-10-24 DIAGNOSIS — I10 Essential (primary) hypertension: Secondary | ICD-10-CM

## 2012-10-24 DIAGNOSIS — R112 Nausea with vomiting, unspecified: Secondary | ICD-10-CM

## 2012-10-24 DIAGNOSIS — N289 Disorder of kidney and ureter, unspecified: Secondary | ICD-10-CM

## 2012-10-24 DIAGNOSIS — E119 Type 2 diabetes mellitus without complications: Secondary | ICD-10-CM

## 2012-10-24 LAB — BASIC METABOLIC PANEL
Calcium: 8.9 mg/dL (ref 8.4–10.5)
Creatinine, Ser: 2.08 mg/dL — ABNORMAL HIGH (ref 0.50–1.10)
GFR calc non Af Amer: 22 mL/min — ABNORMAL LOW (ref >90)
Glucose, Bld: 85 mg/dL (ref 70–99)
Sodium: 138 mEq/L (ref 135–145)

## 2012-10-24 LAB — CBC
MCH: 29.6 pg (ref 26.0–34.0)
Platelets: 199 10*3/uL (ref 150–400)
RBC: 3.35 MIL/uL — ABNORMAL LOW (ref 3.87–5.11)
RDW: 12.9 % (ref 11.5–15.5)

## 2012-10-24 LAB — GLUCOSE, CAPILLARY: Glucose-Capillary: 89 mg/dL (ref 70–99)

## 2012-10-24 MED ORDER — PANTOPRAZOLE SODIUM 40 MG IV SOLR
40.0000 mg | Freq: Two times a day (BID) | INTRAVENOUS | Status: DC
  Administered 2012-10-24: 40 mg via INTRAVENOUS
  Filled 2012-10-24 (×2): qty 40

## 2012-10-24 MED ORDER — PNEUMOCOCCAL VAC POLYVALENT 25 MCG/0.5ML IJ INJ: 0.5000 mL | INJECTION | INTRAMUSCULAR | Status: DC

## 2012-10-24 MED ORDER — PROMETHAZINE HCL 25 MG PO TABS: 25.0000 mg | ORAL_TABLET | Freq: Four times a day (QID) | ORAL | Status: DC | PRN

## 2012-10-24 MED ORDER — ENSURE COMPLETE PO LIQD: 237.0000 mL | Freq: Two times a day (BID) | ORAL | Status: DC

## 2012-10-24 MED ORDER — SUCRALFATE 1 GM/10ML PO SUSP
1.0000 g | Freq: Three times a day (TID) | ORAL | Status: DC
  Administered 2012-10-24: 1 g via ORAL
  Filled 2012-10-24 (×4): qty 10

## 2012-10-24 MED ORDER — OMEPRAZOLE 40 MG PO CPDR: 40.0000 mg | DELAYED_RELEASE_CAPSULE | Freq: Every day | ORAL | Status: DC

## 2012-10-24 MED ORDER — SUCRALFATE 1 GM/10ML PO SUSP: 1.0000 g | Freq: Three times a day (TID) | ORAL | Status: DC

## 2012-10-24 NOTE — Discharge Summary (Signed)
Physician Discharge Summary  Tricia Dean ZOX:[id number] DOB: Jun 02, 1936 DOA: 10/22/2012  PCP: Benita Stabile, MD  Admit date: 10/22/2012 Discharge date: 10/24/2012  Recommendations for Outpatient Follow-up:  1. Primary care doctor within 5 days for repeat creatinine and to make sure lip is continuing to improve off of antibiotics.  Work up for anemia if not already complete.  Please restart ACEI if creatinine tolerates.    Discharge Diagnoses:  Active Problems:  Syncope  Hypertension  Diabetes mellitus   Discharge Condition: stable, improved  Diet recommendation: healthy heart  Wt Readings from Last 3 Encounters:  10/23/12 55.566 kg (122 lb 8 oz)    History of present illness:   This is a 77 year old female who developed an abscess on her right lip on Monday. She went to urgent care where she was prescribed 2 antibiotics including Bactrim and her lip was I&D. By Tuesday her lip was much more swollen, she saw her PCP who again I&D this time the inside of her lip, he continued Bactrim and added Levaquin, the other second antibiotic was discontinued. She was since in form she has a MRSA infection. Since she has been on Bactrim and Levaquin the swelling on her mouth has significantly improved, however, she's developed nausea since Tuesday and started with vomiting once or twice a day since yesterday. Her liquid intake has been decreased. Today while walking around the house she began not to feel well, she developed shakes and eventually syncopized. Family members were close, she did not fall but she did have loss of consciousness for approximately a minute. She was confused approximately 5 minutes after. They brought her to the ER. History provided by the patient as well as multiple family members at bedside.   Hospital Course:   Syncope:  Tricia Dean was hospitalized with a syncopal episode due to dehydration.  Seizure and stroke were considered less likely given her clinical history and  clear dehydration on exam along with lack of focal neurologic deficits.  She was admitted to telemetry and started on IV fluids.  She did not have any arrhythmias on telemetry.  After hydration, she felt much better and did not have further presyncopal symptoms.    MRSA lip abscess:  Her oral antibiotics were thought to be contributing to her nausea and vomiting and they were discontinued.  She was started on IV vancomycin instead and her lip remained minimally swollen and nonerythematous without discharge.  She completed 7-8 days of antibiotics and we will discontinue them.  She may follow up with her PCP for reevaluation in a few days to document full resolution.  Acute on chronic kidney injury:  Her baseline creatinine appears to be around 2 and at admission, her creatinine was 2.5mg /dl.  Her ACEI was held.  She was hydrated and her creatinine trended down to baseline of 2.08mg /dl.  Her BUN also trended down from 53 to 35.    Nausea and vomiting:  Liver function tests, lipase, and UA were unremarkable.  KUB was also unremarkable.  She may have had some side effect of antibiotic or some gastritis contributing to her symptoms.  Her oral antibiotics were discontinued and she was started on protonix.  She continued to have some indigestion like symptoms of epigastric pain and nausea and she was given a dose of carafate which resolved her symptoms.  She should continue PPI and carafate at home.  She was given a prescription for phenergan also.    Hypertension:  Blood pressure was initially within  in normal limits but trended up slightly after hydration and holding ACEI.  Consider restarting ACEI in a few days as an outpatient if creatinine is stable.    Diabetes (diet controlled):  She was started on sliding scale insulin but her fingersticks were generally in the 70-80s.  Her highest fingerstick was 129.  She does not need to continue fingersticks at home and should follow up with her primary care doctor  for reevaulation.    Procedures:  CXR/KUB  Consultations:  None  Discharge Exam: Filed Vitals:   10/24/12 1431  BP: 106/68  Pulse: 92  Temp: 98.8 F (37.1 C)  Resp: 18   Filed Vitals:   10/24/12 0523 10/24/12 0947 10/24/12 1426 10/24/12 1431  BP: 130/39 150/66 107/66 106/68  Pulse: 59 67 83 92  Temp: 98.3 F (36.8 C) 99.3 F (37.4 C) 99.3 F (37.4 C) 98.8 F (37.1 C)  TempSrc: Oral  Oral Oral  Resp: 17 18 18 18   Height:      Weight:      SpO2: 98% 96% 94% 96%    General: CF, no acute distress  HEENT: Right lower lip mildly indurated and swollen, but no erythema  Cardiovascular: RRR, no mrg, 2+ pulses  Respiratory: CTAB  Abdomen: NABS, soft, nondistended, nontender, no organomegaly  MSK: Normal tone and bulk, trace lower extremity edema Neuro: Grossly intact   Discharge Instructions      Discharge Orders    Future Orders Please Complete By Expires   Diet - low sodium heart healthy      Diet Carb Modified      Increase activity slowly      Discharge instructions      Comments:   You were hospitalized with a fainting spell that was likely due to dehydration from nausea and vomiting.  Your tests for hepatitis and pancreatitis were negative.  You may have had some side effect from your antibiotic or some gastritis.  Please stop your oral antibiotics.  You were given IV antibiotics in the hospital.  Please have your primary care doctor reexamine your lip in a few days.  For gastritis, you should take omeprazole 40mg  daily .  Please use carafate 4 times a day until you have no more abdominal pain and nausea, then stop.  You had some dehydration that caused some mild kidney injury.  Your kidneys improved back to their baseline after two days of hydration.  Your quinapril can injury your kidneys if you are dehydrated so it was stopped.  Your primary care doctor may restart it in a few days.  Your blood pressure may be mildly elevated for a few days, but I anticipate you  will be able to start your blood pressure medication again soon.   Call MD for:  temperature >100.4      Call MD for:  persistant nausea and vomiting      Call MD for:  severe uncontrolled pain      Call MD for:  redness, tenderness, or signs of infection (pain, swelling, redness, odor or green/yellow discharge around incision site)      Call MD for:  difficulty breathing, headache or visual disturbances      Call MD for:  hives      Call MD for:  persistant dizziness or light-headedness      Call MD for:  extreme fatigue          Medication List     As of 10/24/2012  2:47 PM  STOP taking these medications         levofloxacin 750 MG tablet   Commonly known as: LEVAQUIN      quinapril 40 MG tablet   Commonly known as: ACCUPRIL      sulfamethoxazole-trimethoprim 800-160 MG per tablet   Commonly known as: BACTRIM DS      TAKE these medications         donepezil 10 MG tablet   Commonly known as: ARICEPT   Take 10 mg by mouth at bedtime.      feeding supplement Liqd   Take 237 mLs by mouth 2 (two) times daily between meals.      omeprazole 40 MG capsule   Commonly known as: PRILOSEC   Take 1 capsule (40 mg total) by mouth daily.      promethazine 25 MG tablet   Commonly known as: PHENERGAN   Take 1 tablet (25 mg total) by mouth every 6 (six) hours as needed. For nausea      sucralfate 1 GM/10ML suspension   Commonly known as: CARAFATE   Take 10 mLs (1 g total) by mouth 4 (four) times daily -  with meals and at bedtime.        Follow-up Information    Follow up with Benita Stabile, MD. Schedule an appointment as soon as possible for a visit in 1 week.   Contact information:   [facility] [address] [place] [address] [phone]           The results of significant diagnostics from this hospitalization (including imaging, microbiology, ancillary and laboratory) are listed below for reference.     Significant Diagnostic Studies: Dg Abd Acute W/chest  10/23/2012  *RADIOLOGY REPORT*  Clinical Data: Vomiting, loss of consciousness  ACUTE ABDOMEN SERIES (ABDOMEN 2 VIEW & CHEST 1 VIEW)  Comparison: None  Findings: Upper-normal size of cardiac silhouette. Atherosclerotic calcification aorta. Pulmonary vascularity normal. Peribronchial thickening without infiltrate or effusion. No pneumothorax. Paucity of bowel gas. Increased stool in rectum. Surgical clips in pelvis. No bowel dilatation, bowel wall thickening or free intraperitoneal air. Diffuse osseous demineralization. No definite urinary tract calcification.  IMPRESSION: Bronchitic changes. No definite acute abdominal findings.   Original Report Authenticated By: Ulyses Southward, M.D.     Microbiology: No results found for this or any previous visit (from the past 240 hour(s)).   Labs: Basic Metabolic Panel:  Lab 10/24/12 5284 10/23/12 0956 10/23/12 0710 10/22/12 2316 10/22/12 2311  NA 138 141 -- -- 137  K 3.9 3.8 -- -- 3.5  CL 107 104 -- -- 100  CO2 22 22 -- -- 19  GLUCOSE 85 89 -- -- 125*  BUN 35* 48* -- -- 53*  CREATININE 2.08* 2.27* 2.31* -- 2.58*  CALCIUM 8.9 9.2 -- -- 9.7  MG -- -- -- 1.7 --  PHOS -- -- -- -- --   Liver Function Tests:  Lab 10/22/12 2311  AST 17  ALT 7  ALKPHOS 56  BILITOT 0.3  PROT 7.2  ALBUMIN 3.4*    Lab 10/23/12 0956  LIPASE 16  AMYLASE --   No results found for this basename: AMMONIA:5 in the last 168 hours CBC:  Lab 10/24/12 0555 10/23/12 0956 10/23/12 0710 10/22/12 2311  WBC 6.5 8.3 7.5 6.8  NEUTROABS -- -- -- 5.1  HGB 9.9* 10.8* 10.3* 10.4*  HCT 29.6* 32.7* 30.3* 31.7*  MCV 88.4 87.9 87.6 87.3  PLT 199 241 227 231  Cardiac Enzymes: No results found for this basename: CKTOTAL:5,CKMB:5,CKMBINDEX:5,TROPONINI:5 in the last 168 hours BNP: BNP (last 3 results) No results found for this basename: PROBNP:3 in the last 8760 hours CBG:  Lab 10/24/12 1138 10/24/12 0806 10/23/12 2106  10/23/12 1644  GLUCAP 129* 89 84 77    Time coordinating discharge: 45 minutes  Signed:  Akya Fiorello  [facility] 10/24/2012, 2:47 PM

## 2012-10-24 NOTE — Progress Notes (Signed)
Pt discharged to home after visit summary reviewed and pt capable of re verbalizing medications and follow up appointments. Pt remains stable. No signs and symptoms of distress. Educated to return to ER in the event of SOB, dizziness, chest pain, or fainting. Cleopatra Sardo, RN   

## 2012-11-04 ENCOUNTER — Other Ambulatory Visit: Payer: Self-pay | Admitting: Gastroenterology

## 2012-11-04 DIAGNOSIS — R634 Abnormal weight loss: Secondary | ICD-10-CM

## 2012-11-04 DIAGNOSIS — R109 Unspecified abdominal pain: Secondary | ICD-10-CM

## 2012-11-04 DIAGNOSIS — R112 Nausea with vomiting, unspecified: Secondary | ICD-10-CM

## 2012-11-05 ENCOUNTER — Ambulatory Visit
Admission: RE | Admit: 2012-11-05 | Discharge: 2012-11-05 | Disposition: A | Discharge status: Home / Self Care | Payer: Medicare Other | Source: Ambulatory Visit | Attending: Gastroenterology | Admitting: Gastroenterology

## 2012-11-05 DIAGNOSIS — R112 Nausea with vomiting, unspecified: Secondary | ICD-10-CM

## 2012-11-05 DIAGNOSIS — R634 Abnormal weight loss: Secondary | ICD-10-CM

## 2012-11-05 DIAGNOSIS — R109 Unspecified abdominal pain: Secondary | ICD-10-CM

## 2012-11-08 ENCOUNTER — Other Ambulatory Visit (HOSPITAL_COMMUNITY): Payer: Self-pay | Admitting: Urology

## 2012-11-08 DIAGNOSIS — D49519 Neoplasm of unspecified behavior of unspecified kidney: Secondary | ICD-10-CM

## 2012-11-12 ENCOUNTER — Ambulatory Visit (HOSPITAL_COMMUNITY)
Admission: RE | Admit: 2012-11-12 | Discharge: 2012-11-12 | Disposition: A | Discharge status: Home / Self Care | Payer: Medicare Other | Source: Ambulatory Visit | Attending: Urology | Admitting: Urology

## 2012-11-12 DIAGNOSIS — K8020 Calculus of gallbladder without cholecystitis without obstruction: Secondary | ICD-10-CM | POA: Insufficient documentation

## 2012-11-12 DIAGNOSIS — R634 Abnormal weight loss: Secondary | ICD-10-CM | POA: Insufficient documentation

## 2012-11-12 DIAGNOSIS — N289 Disorder of kidney and ureter, unspecified: Secondary | ICD-10-CM | POA: Insufficient documentation

## 2012-11-12 DIAGNOSIS — R109 Unspecified abdominal pain: Secondary | ICD-10-CM | POA: Insufficient documentation

## 2012-11-12 DIAGNOSIS — D49519 Neoplasm of unspecified behavior of unspecified kidney: Secondary | ICD-10-CM

## 2012-11-12 MED ORDER — GADOBENATE DIMEGLUMINE 529 MG/ML IV SOLN
10.0000 mL | Freq: Once | INTRAVENOUS | Status: AC | PRN
  Administered 2012-11-12: 6 mL via INTRAVENOUS

## 2014-02-15 ENCOUNTER — Other Ambulatory Visit (HOSPITAL_COMMUNITY): Payer: Self-pay | Admitting: Urology

## 2014-02-15 ENCOUNTER — Ambulatory Visit (HOSPITAL_COMMUNITY)
Admission: RE | Admit: 2014-02-15 | Discharge: 2014-02-15 | Disposition: A | Discharge status: Home / Self Care | Payer: Medicare Other | Source: Ambulatory Visit | Attending: Urology | Admitting: Urology

## 2014-02-15 DIAGNOSIS — N2889 Other specified disorders of kidney and ureter: Secondary | ICD-10-CM

## 2014-02-15 DIAGNOSIS — N289 Disorder of kidney and ureter, unspecified: Secondary | ICD-10-CM | POA: Insufficient documentation

## 2014-09-10 ENCOUNTER — Inpatient Hospital Stay (HOSPITAL_COMMUNITY)
Admission: EM | Admit: 2014-09-10 | Discharge: 2014-09-16 | DRG: 872 | Disposition: A | Discharge status: Home / Self Care | Payer: Medicare Other | Attending: Internal Medicine | Admitting: Internal Medicine

## 2014-09-10 ENCOUNTER — Encounter (HOSPITAL_COMMUNITY): Payer: Self-pay

## 2014-09-10 DIAGNOSIS — A4102 Sepsis due to Methicillin resistant Staphylococcus aureus: Principal | ICD-10-CM | POA: Diagnosis present

## 2014-09-10 DIAGNOSIS — K5900 Constipation, unspecified: Secondary | ICD-10-CM | POA: Diagnosis not present

## 2014-09-10 DIAGNOSIS — F09 Unspecified mental disorder due to known physiological condition: Secondary | ICD-10-CM | POA: Diagnosis present

## 2014-09-10 DIAGNOSIS — L0390 Cellulitis, unspecified: Secondary | ICD-10-CM | POA: Diagnosis present

## 2014-09-10 DIAGNOSIS — K130 Diseases of lips: Secondary | ICD-10-CM | POA: Diagnosis present

## 2014-09-10 DIAGNOSIS — N179 Acute kidney failure, unspecified: Secondary | ICD-10-CM | POA: Diagnosis present

## 2014-09-10 DIAGNOSIS — L03211 Cellulitis of face: Secondary | ICD-10-CM | POA: Diagnosis present

## 2014-09-10 DIAGNOSIS — I10 Essential (primary) hypertension: Secondary | ICD-10-CM | POA: Diagnosis present

## 2014-09-10 DIAGNOSIS — E119 Type 2 diabetes mellitus without complications: Secondary | ICD-10-CM | POA: Diagnosis present

## 2014-09-10 DIAGNOSIS — B9562 Methicillin resistant Staphylococcus aureus infection as the cause of diseases classified elsewhere: Secondary | ICD-10-CM | POA: Insufficient documentation

## 2014-09-10 DIAGNOSIS — R059 Cough, unspecified: Secondary | ICD-10-CM

## 2014-09-10 DIAGNOSIS — R05 Cough: Secondary | ICD-10-CM

## 2014-09-10 DIAGNOSIS — Z880 Allergy status to penicillin: Secondary | ICD-10-CM

## 2014-09-10 DIAGNOSIS — R7881 Bacteremia: Secondary | ICD-10-CM | POA: Insufficient documentation

## 2014-09-10 DIAGNOSIS — F0390 Unspecified dementia without behavioral disturbance: Secondary | ICD-10-CM | POA: Diagnosis present

## 2014-09-10 DIAGNOSIS — R509 Fever, unspecified: Secondary | ICD-10-CM | POA: Diagnosis present

## 2014-09-10 DIAGNOSIS — C649 Malignant neoplasm of unspecified kidney, except renal pelvis: Secondary | ICD-10-CM | POA: Diagnosis present

## 2014-09-10 DIAGNOSIS — L0201 Cutaneous abscess of face: Secondary | ICD-10-CM | POA: Insufficient documentation

## 2014-09-10 DIAGNOSIS — K219 Gastro-esophageal reflux disease without esophagitis: Secondary | ICD-10-CM | POA: Diagnosis present

## 2014-09-10 DIAGNOSIS — R55 Syncope and collapse: Secondary | ICD-10-CM

## 2014-09-10 HISTORY — DX: Malignant (primary) neoplasm, unspecified: C80.1

## 2014-09-10 LAB — CBC WITH DIFFERENTIAL/PLATELET
BASOS PCT: 0 % (ref 0–1)
Basophils Absolute: 0 10*3/uL (ref 0.0–0.1)
EOS ABS: 0 10*3/uL (ref 0.0–0.7)
Eosinophils Relative: 0 % (ref 0–5)
HCT: 34.1 % — ABNORMAL LOW (ref 36.0–46.0)
HEMOGLOBIN: 11.2 g/dL — AB (ref 12.0–15.0)
LYMPHS ABS: 0.6 10*3/uL — AB (ref 0.7–4.0)
Lymphocytes Relative: 5 % — ABNORMAL LOW (ref 12–46)
MCH: 30 pg (ref 26.0–34.0)
MCHC: 32.8 g/dL (ref 30.0–36.0)
MCV: 91.4 fL (ref 78.0–100.0)
MONOS PCT: 4 % (ref 3–12)
Monocytes Absolute: 0.5 10*3/uL (ref 0.1–1.0)
NEUTROS ABS: 10.5 10*3/uL — AB (ref 1.7–7.7)
NEUTROS PCT: 91 % — AB (ref 43–77)
PLATELETS: 183 10*3/uL (ref 150–400)
RBC: 3.73 MIL/uL — AB (ref 3.87–5.11)
RDW: 13.1 % (ref 11.5–15.5)
WBC: 11.6 10*3/uL — ABNORMAL HIGH (ref 4.0–10.5)

## 2014-09-10 LAB — I-STAT CG4 LACTIC ACID, ED: Lactic Acid, Venous: 1.09 mmol/L (ref 0.5–2.2)

## 2014-09-10 LAB — COMPREHENSIVE METABOLIC PANEL
ALBUMIN: 3.7 g/dL (ref 3.5–5.2)
ALK PHOS: 88 U/L (ref 39–117)
ALT: 9 U/L (ref 0–35)
AST: 16 U/L (ref 0–37)
Anion gap: 13 (ref 5–15)
BILIRUBIN TOTAL: 0.8 mg/dL (ref 0.3–1.2)
BUN: 26 mg/dL — AB (ref 6–23)
CHLORIDE: 100 meq/L (ref 96–112)
CO2: 26 mEq/L (ref 19–32)
Calcium: 9.5 mg/dL (ref 8.4–10.5)
Creatinine, Ser: 1.53 mg/dL — ABNORMAL HIGH (ref 0.50–1.10)
GFR calc Af Amer: 37 mL/min — ABNORMAL LOW (ref >90)
GFR calc non Af Amer: 32 mL/min — ABNORMAL LOW (ref >90)
Glucose, Bld: 160 mg/dL — ABNORMAL HIGH (ref 70–99)
POTASSIUM: 4.2 meq/L (ref 3.7–5.3)
SODIUM: 139 meq/L (ref 137–147)
TOTAL PROTEIN: 7.6 g/dL (ref 6.0–8.3)

## 2014-09-10 LAB — GLUCOSE, CAPILLARY
Glucose-Capillary: 192 mg/dL — ABNORMAL HIGH (ref 70–99)
Glucose-Capillary: 217 mg/dL — ABNORMAL HIGH (ref 70–99)

## 2014-09-10 LAB — MRSA PCR SCREENING: MRSA by PCR: POSITIVE — AB

## 2014-09-10 MED ORDER — TRAZODONE HCL 50 MG PO TABS
50.0000 mg | ORAL_TABLET | Freq: Every day | ORAL | Status: DC
  Administered 2014-09-10 – 2014-09-15 (×6): 50 mg via ORAL
  Filled 2014-09-10 (×7): qty 1

## 2014-09-10 MED ORDER — ACETAMINOPHEN 325 MG PO TABS
650.0000 mg | ORAL_TABLET | Freq: Four times a day (QID) | ORAL | Status: DC | PRN
  Administered 2014-09-10 – 2014-09-11 (×2): 650 mg via ORAL
  Filled 2014-09-10 (×2): qty 2

## 2014-09-10 MED ORDER — VANCOMYCIN HCL IN DEXTROSE 1-5 GM/200ML-% IV SOLN
1000.0000 mg | INTRAVENOUS | Status: DC
  Filled 2014-09-10: qty 200

## 2014-09-10 MED ORDER — INSULIN ASPART 100 UNIT/ML ~~LOC~~ SOLN
0.0000 [IU] | Freq: Three times a day (TID) | SUBCUTANEOUS | Status: DC
  Administered 2014-09-11 – 2014-09-15 (×2): 1 [IU] via SUBCUTANEOUS

## 2014-09-10 MED ORDER — ACETAMINOPHEN 325 MG PO TABS
650.0000 mg | ORAL_TABLET | Freq: Four times a day (QID) | ORAL | Status: DC | PRN
  Administered 2014-09-10: 650 mg via ORAL
  Filled 2014-09-10: qty 2

## 2014-09-10 MED ORDER — ENOXAPARIN SODIUM 30 MG/0.3ML ~~LOC~~ SOLN
30.0000 mg | SUBCUTANEOUS | Status: DC
  Administered 2014-09-10 – 2014-09-15 (×6): 30 mg via SUBCUTANEOUS
  Filled 2014-09-10 (×7): qty 0.3

## 2014-09-10 MED ORDER — LIDOCAINE HCL (PF) 1 % IJ SOLN
5.0000 mL | Freq: Once | INTRAMUSCULAR | Status: AC
  Administered 2014-09-10: 5 mL
  Filled 2014-09-10: qty 5

## 2014-09-10 MED ORDER — DONEPEZIL HCL 10 MG PO TABS
10.0000 mg | ORAL_TABLET | Freq: Every day | ORAL | Status: DC
  Administered 2014-09-10 – 2014-09-15 (×6): 10 mg via ORAL
  Filled 2014-09-10 (×7): qty 1

## 2014-09-10 MED ORDER — PANTOPRAZOLE SODIUM 40 MG PO TBEC
40.0000 mg | DELAYED_RELEASE_TABLET | Freq: Every day | ORAL | Status: DC
  Administered 2014-09-11 – 2014-09-15 (×4): 40 mg via ORAL
  Filled 2014-09-10 (×2): qty 1

## 2014-09-10 MED ORDER — ONDANSETRON HCL 4 MG/2ML IJ SOLN
4.0000 mg | Freq: Once | INTRAMUSCULAR | Status: AC
  Administered 2014-09-10: 4 mg via INTRAVENOUS
  Filled 2014-09-10: qty 2

## 2014-09-10 MED ORDER — SODIUM CHLORIDE 0.9 % IV SOLN
INTRAVENOUS | Status: DC
  Administered 2014-09-10: 20:00:00 via INTRAVENOUS

## 2014-09-10 MED ORDER — VANCOMYCIN HCL IN DEXTROSE 1-5 GM/200ML-% IV SOLN
1000.0000 mg | Freq: Once | INTRAVENOUS | Status: AC
  Administered 2014-09-10: 1000 mg via INTRAVENOUS
  Filled 2014-09-10: qty 200

## 2014-09-10 MED ORDER — ONDANSETRON HCL 4 MG/2ML IJ SOLN: 4.0000 mg | Freq: Four times a day (QID) | INTRAMUSCULAR | Status: DC | PRN

## 2014-09-10 NOTE — H&P (Addendum)
[facility] History and Physical  Tricia Dean XLK:[id number] DOB: 05-May-1936 DOA: 09/10/2014  Referring physician: EDP PCP: Donnie Coffin, MD   Chief Complaint: R facial rash  HPI: Tricia Dean is a 78 y.o. female with PMH of DM-diet controlled, HTN, Renal cancer on surveillance per [facility], presents to the ER with the above complaints. She lives with her daughter who noticed area of redness on the R side of her face last night, which worsened this morning and developed a boil with some purulence. After this she vomited, was taken to Urgent care and was febrile with temp of 102 and sent to the ER. In ER, she had a small I&D with unroofing of the boil, a continued to be febrile despite IVF and Abx and TRH consulted  Review of Systems: positives bolded Constitutional:  No weight loss, night sweats, Fevers, chills, fatigue.  HEENT:  No headaches, Difficulty swallowing,Tooth/dental problems,Sore throat,  No sneezing, itching, ear ache, nasal congestion, post nasal drip,  Cardio-vascular:  No chest pain, Orthopnea, PND, swelling in lower extremities, anasarca, dizziness, palpitations  GI:  No heartburn, indigestion, abdominal pain, nausea, vomiting, diarrhea, change in bowel habits, loss of appetite  Resp:  No shortness of breath with exertion or at rest. No excess mucus, no productive cough, No non-productive cough, No coughing up of blood.No change in color of mucus.No wheezing.No chest wall deformity  Skin:  rash or lesions.  GU:  no dysuria, change in color of urine, no urgency or frequency. No flank pain.  Musculoskeletal:  No joint pain or swelling. No decreased range of motion. No back pain.  Psych:  No change in mood or affect. No depression or anxiety. No memory loss.   Past Medical History  Diagnosis Date  . Hypertension   . Diabetes mellitus without complication   . Cancer     Renal   Past Surgical History  Procedure Laterality Date  .  Abdominal hysterectomy     Social History:  reports that she has never smoked. She does not have any smokeless tobacco history on file. She reports that she does not drink alcohol or use illicit drugs.  Allergies  Allergen Reactions  . Other     "miacin"?  . Penicillins     Family History  Problem Relation Age of Onset  . Hypertension       Prior to Admission medications   Medication Sig Start Date End Date Taking? Authorizing Provider  donepezil (ARICEPT) 10 MG tablet Take 10 mg by mouth at bedtime.   Yes Historical Provider, MD  omeprazole (PRILOSEC) 40 MG capsule Take 1 capsule (40 mg total) by mouth daily. 10/24/12  Yes Janece Canterbury, MD  traZODone (DESYREL) 50 MG tablet Take 50 mg by mouth at bedtime. 09/10/14  Yes Historical Provider, MD  feeding supplement (ENSURE COMPLETE) LIQD Take 237 mLs by mouth 2 (two) times daily between meals. Patient not taking: Reported on 09/10/2014 10/24/12   Janece Canterbury, MD  promethazine (PHENERGAN) 25 MG tablet Take 1 tablet (25 mg total) by mouth every 6 (six) hours as needed. For nausea Patient not taking: Reported on 09/10/2014 10/24/12   Janece Canterbury, MD  sucralfate (CARAFATE) 1 GM/10ML suspension Take 10 mLs (1 g total) by mouth 4 (four) times daily -  with meals and at bedtime. Patient not taking: Reported on 09/10/2014 10/24/12   Janece Canterbury, MD   Physical Exam: Filed Vitals:   09/10/14 1339 09/10/14 1500 09/10/14 1512 09/10/14 1516  BP: 155/70  120/44  Pulse: 96 88  87  Temp: 102.8 F (39.3 C)  102.9 F (39.4 C)   TempSrc: Oral  Oral   Resp: 20 29  24   Height:      Weight:      SpO2: 100% 98%  96%    Wt Readings from Last 3 Encounters:  09/10/14 58.514 kg (129 lb)  10/23/12 55.566 kg (122 lb 8 oz)    General:  Appears calm and comfortable, no distress HEENT: small boil drained with area of erythema, swelling and tenderness, extending from malar region to chin, area of erythema and swelling extending to buccal  mucosa Eyes: PERRL, normal lids, irises & conjunctiva ENT: grossly normal  lips & tongue Neck: no LAD, masses or thyromegaly Cardiovascular: RRR, no m/r/g. No LE edema. Respiratory: CTA bilaterally, no w/r/r. Normal respiratory effort. Abdomen: soft, ntnd Skin: no rash or induration seen on limited exam Musculoskeletal: grossly normal tone BUE/BLE Psychiatric: grossly normal mood and affect, speech fluent and appropriate Neurologic: grossly non-focal.          Labs on Admission:  Basic Metabolic Panel:  Recent Labs Lab 09/10/14 1258  NA 139  K 4.2  CL 100  CO2 26  GLUCOSE 160*  BUN 26*  CREATININE 1.53*  CALCIUM 9.5   Liver Function Tests:  Recent Labs Lab 09/10/14 1258  AST 16  ALT 9  ALKPHOS 88  BILITOT 0.8  PROT 7.6  ALBUMIN 3.7   No results for input(s): LIPASE, AMYLASE in the last 168 hours. No results for input(s): AMMONIA in the last 168 hours. CBC:  Recent Labs Lab 09/10/14 1258  WBC 11.6*  NEUTROABS 10.5*  HGB 11.2*  HCT 34.1*  MCV 91.4  PLT 183   Cardiac Enzymes: No results for input(s): CKTOTAL, CKMB, CKMBINDEX, TROPONINI in the last 168 hours.  BNP (last 3 results) No results for input(s): PROBNP in the last 8760 hours. CBG: No results for input(s): GLUCAP in the last 168 hours.  Radiological Exams on Admission: No results found.  Assessment/Plan Principal Problem:   Facial cellulitis  -with small boil which was drained in ER - start IV Vanc, IVF - check orthopantogram  Mild cognitive dysfunction -continue aricept  Mild AKi -hydrate, monitor -bmet in am  DM -diet controlled, check Hbaic -SSI  Renal cell cancer -Followed by [facility], on surveillance only  Code Status: Full Code DVT Prophylaxis:lovenox Family Communication: d/w daughter at bedside Disposition Plan: inpatient  Time spent: 67min  [facility] Pager [phone]

## 2014-09-10 NOTE — ED Notes (Signed)
Pt had a bump last night to the left of her lip.  This morning there is a large bump with redness and swelling around the left side of mouth.  Pt's family reports hx of same requiring hospital admission.

## 2014-09-10 NOTE — Progress Notes (Signed)
ANTIBIOTIC CONSULT NOTE - INITIAL  Pharmacy Consult for Vancomcyin Indication: Cellulitis  Allergies  Allergen Reactions  . Other     "miacin"?  . Penicillins     Patient Measurements: Height: 5\' 2"  (157.5 cm) Weight: 129 lb (58.514 kg) IBW/kg (Calculated) : 50.1 Adjusted Body Weight:   Vital Signs: Temp: 102.8 F (39.3 C) (11/22 1339) Temp Source: Oral (11/22 1339) BP: 155/70 mmHg (11/22 1339) Pulse Rate: 96 (11/22 1339) Intake/Output from previous day:   Intake/Output from this shift:    Labs:  Recent Labs  09/10/14 1258  WBC 11.6*  HGB 11.2*  PLT 183  CREATININE 1.53*   Estimated Creatinine Clearance: 24.7 mL/min (by C-G formula based on Cr of 1.53). No results for input(s): VANCOTROUGH, VANCOPEAK, VANCORANDOM, GENTTROUGH, GENTPEAK, GENTRANDOM, TOBRATROUGH, TOBRAPEAK, TOBRARND, AMIKACINPEAK, AMIKACINTROU, AMIKACIN in the last 72 hours.   Microbiology: No results found for this or any previous visit (from the past 720 hour(s)).  Medical History: Past Medical History  Diagnosis Date  . Hypertension   . Diabetes mellitus without complication   . Cancer     Renal    Medications:  Scheduled:   Assessment: 78yo female with Cellulitis of lip with fever.  She has had a similar episode in the past, (+)MRSA.  Cr 1.53, T 102. 8, WBC 11.6.  She has received no antibiotics.   Goal of Therapy:  Vancomycin trough level 10-15 mcg/ml  Plan:  Vancomycin 1000mg  IV q48 F/U culture data Watch renal fxn Vancomycin trough when appropriate  Ron Beske P 09/10/2014,2:10 PM

## 2014-09-10 NOTE — ED Provider Notes (Signed)
CSN: [id number]     Arrival date & time 09/10/14  1207 History   First MD Initiated Contact with Patient 09/10/14 1335     Chief Complaint  Patient presents with  . Facial Swelling  . Fever   HPI Patient presents to the emergency room with complaints of fever and lip swelling. The patient and family noticed last night that she developed a small bump in the outer aspect of her lower face and lip.  This morning the redness and swelling increased. She went to an urgent care who suggested she come to the emergency department. The patient does have a history of a similar episode in the past which required hospitalization. Infection was MRSA.  Patient also developed nausea and vomiting and high fevers today. Denies any sore throat. No chest pain or shortness of breath or difficulty swallowing. Past Medical History  Diagnosis Date  . Hypertension   . Diabetes mellitus without complication   . Cancer     Renal   Past Surgical History  Procedure Laterality Date  . Abdominal hysterectomy     Family History  Problem Relation Age of Onset  . Hypertension     History  Substance Use Topics  . Smoking status: Never Smoker   . Smokeless tobacco: Not on file  . Alcohol Use: No   OB History    No data available     Review of Systems  All other systems reviewed and are negative.     Allergies  Other and Penicillins  Home Medications   Prior to Admission medications   Medication Sig Start Date End Date Taking? Authorizing Provider  donepezil (ARICEPT) 10 MG tablet Take 10 mg by mouth at bedtime.   Yes Historical Provider, MD  omeprazole (PRILOSEC) 40 MG capsule Take 1 capsule (40 mg total) by mouth daily. 10/24/12  Yes Janece Canterbury, MD  traZODone (DESYREL) 50 MG tablet Take 50 mg by mouth at bedtime. 09/10/14  Yes Historical Provider, MD  feeding supplement (ENSURE COMPLETE) LIQD Take 237 mLs by mouth 2 (two) times daily between meals. Patient not taking: Reported on 09/10/2014  10/24/12   Janece Canterbury, MD  promethazine (PHENERGAN) 25 MG tablet Take 1 tablet (25 mg total) by mouth every 6 (six) hours as needed. For nausea Patient not taking: Reported on 09/10/2014 10/24/12   Janece Canterbury, MD  sucralfate (CARAFATE) 1 GM/10ML suspension Take 10 mLs (1 g total) by mouth 4 (four) times daily -  with meals and at bedtime. Patient not taking: Reported on 09/10/2014 10/24/12   Janece Canterbury, MD   BP 120/44 mmHg  Pulse 87  Temp(Src) 102.9 F (39.4 C) (Oral)  Resp 24  Ht 5\' 2"  (1.575 m)  Wt 129 lb (58.514 kg)  BMI 23.59 kg/m2  SpO2 96% Physical Exam  Constitutional: She appears well-developed and well-nourished. No distress.  HENT:  Head: Normocephalic and atraumatic.  Right Ear: External ear normal.  Left Ear: External ear normal.  Erythema with some induration of the left lower cheek inferior and lateral to the lower lip, small superficial pustule-like lesion without surrounding fluctuance, the area is indurated and tender, no mucosal oral lesions noted  Eyes: Conjunctivae are normal. Right eye exhibits no discharge. Left eye exhibits no discharge. No scleral icterus.  Neck: Neck supple. No tracheal deviation present.  Cardiovascular: Normal rate, regular rhythm and intact distal pulses.   Pulmonary/Chest: Effort normal and breath sounds normal. No stridor. No respiratory distress. She has no wheezes. She has no  rales.  Abdominal: Soft. Bowel sounds are normal. She exhibits no distension. There is no tenderness. There is no rebound and no guarding.  Musculoskeletal: She exhibits no edema or tenderness.  Neurological: She is alert. She has normal strength. No cranial nerve deficit (no facial droop, extraocular movements intact, no slurred speech) or sensory deficit. She exhibits normal muscle tone. She displays no seizure activity. Coordination normal.  Skin: Skin is warm and dry. No rash noted.  Psychiatric: She has a normal mood and affect.  Nursing note and  vitals reviewed.   ED Course  INCISION AND DRAINAGE Date/Time: 09/10/2014 3:48 PM Performed by: Dorie Rank Authorized by: Dorie Rank Consent: Verbal consent obtained. Written consent not obtained. Risks and benefits: risks, benefits and alternatives were discussed Consent given by: patient Time out: Immediately prior to procedure a "time out" was called to verify the correct patient, procedure, equipment, support staff and site/side marked as required. Type: abscess Body area: head/neck Location details: face Anesthesia: local infiltration Local anesthetic: lidocaine 1% without epinephrine Anesthetic total: 0.5 ml Patient sedated: no Needle gauge: 18 Complexity: simple Drainage: purulent Drainage amount: scant Wound treatment: wound left open Patient tolerance: Patient tolerated the procedure well with no immediate complications Comments: Bedside US showed very small fluid collection   (including critical care time) Labs Review Labs Reviewed  CBC WITH DIFFERENTIAL - Abnormal; Notable for the following:    WBC 11.6 (*)    RBC 3.73 (*)    Hemoglobin 11.2 (*)    HCT 34.1 (*)    Neutrophils Relative % 91 (*)    Neutro Abs 10.5 (*)    Lymphocytes Relative 5 (*)    Lymphs Abs 0.6 (*)    All other components within normal limits  COMPREHENSIVE METABOLIC PANEL - Abnormal; Notable for the following:    Glucose, Bld 160 (*)    BUN 26 (*)    Creatinine, Ser 1.53 (*)    GFR calc non Af Amer 32 (*)    GFR calc Af Amer 37 (*)    All other components within normal limits  CULTURE, BLOOD (ROUTINE X 2)  CULTURE, BLOOD (ROUTINE X 2)  I-STAT CG4 LACTIC ACID, ED    Medications  acetaminophen (TYLENOL) tablet 650 mg (650 mg Oral Given 09/10/14 1424)  vancomycin (VANCOCIN) IVPB 1000 mg/200 mL premix (1,000 mg Intravenous New Bag/Given 09/10/14 1512)  vancomycin (VANCOCIN) IVPB 1000 mg/200 mL premix (not administered)  lidocaine (PF) (XYLOCAINE) 1 % injection 5 mL (not administered)   ondansetron (ZOFRAN) injection 4 mg (4 mg Intravenous Given 09/10/14 1407)     MDM   Final diagnoses:  Facial cellulitis  Facial abscess    Patient appears to have a facial cellulitis. .  Will start IV vancomycin to cover for MRSA.  Pt initially wanted to be treated as an outpatient however while in the ED her area of erythema seems to be spreading.  No resp compromise.  Does not appear septic.  Will consult with medical service regarding admission for IV abx    Dorie Rank, MD 09/10/14 1550

## 2014-09-10 NOTE — Plan of Care (Signed)
Problem: Phase I Progression Outcomes Goal: OOB as tolerated unless otherwise ordered Outcome: Completed/Met Date Met:  09/10/14     

## 2014-09-11 ENCOUNTER — Inpatient Hospital Stay (HOSPITAL_COMMUNITY): Payer: Medicare Other

## 2014-09-11 LAB — COMPREHENSIVE METABOLIC PANEL
ALT: 7 U/L (ref 0–35)
ANION GAP: 14 (ref 5–15)
AST: 15 U/L (ref 0–37)
Albumin: 2.8 g/dL — ABNORMAL LOW (ref 3.5–5.2)
Alkaline Phosphatase: 69 U/L (ref 39–117)
BILIRUBIN TOTAL: 0.6 mg/dL (ref 0.3–1.2)
BUN: 28 mg/dL — AB (ref 6–23)
CHLORIDE: 106 meq/L (ref 96–112)
CO2: 23 meq/L (ref 19–32)
CREATININE: 1.51 mg/dL — AB (ref 0.50–1.10)
Calcium: 8 mg/dL — ABNORMAL LOW (ref 8.4–10.5)
GFR calc Af Amer: 38 mL/min — ABNORMAL LOW (ref >90)
GFR, EST NON AFRICAN AMERICAN: 32 mL/min — AB (ref >90)
Glucose, Bld: 110 mg/dL — ABNORMAL HIGH (ref 70–99)
Potassium: 3.2 mEq/L — ABNORMAL LOW (ref 3.7–5.3)
Sodium: 143 mEq/L (ref 137–147)
Total Protein: 6.3 g/dL (ref 6.0–8.3)

## 2014-09-11 LAB — HEMOGLOBIN A1C
Hgb A1c MFr Bld: 5.9 % — ABNORMAL HIGH (ref <5.7)
Hgb A1c MFr Bld: 6 % — ABNORMAL HIGH (ref <5.7)
Mean Plasma Glucose: 123 mg/dL — ABNORMAL HIGH (ref <117)
Mean Plasma Glucose: 126 mg/dL — ABNORMAL HIGH (ref <117)

## 2014-09-11 LAB — CBC
HCT: 28.8 % — ABNORMAL LOW (ref 36.0–46.0)
Hemoglobin: 9.4 g/dL — ABNORMAL LOW (ref 12.0–15.0)
MCH: 29.3 pg (ref 26.0–34.0)
MCHC: 32.6 g/dL (ref 30.0–36.0)
MCV: 89.7 fL (ref 78.0–100.0)
PLATELETS: 136 10*3/uL — AB (ref 150–400)
RBC: 3.21 MIL/uL — ABNORMAL LOW (ref 3.87–5.11)
RDW: 13.5 % (ref 11.5–15.5)
WBC: 11.3 10*3/uL — ABNORMAL HIGH (ref 4.0–10.5)

## 2014-09-11 LAB — GLUCOSE, CAPILLARY
GLUCOSE-CAPILLARY: 98 mg/dL (ref 70–99)
Glucose-Capillary: 146 mg/dL — ABNORMAL HIGH (ref 70–99)
Glucose-Capillary: 106 mg/dL — ABNORMAL HIGH (ref 70–99)
Glucose-Capillary: 105 mg/dL — ABNORMAL HIGH (ref 70–99)

## 2014-09-11 MED ORDER — IOHEXOL 300 MG/ML  SOLN
60.0000 mL | Freq: Once | INTRAMUSCULAR | Status: AC | PRN
  Administered 2014-09-11: 70 mL via INTRAVENOUS

## 2014-09-11 MED ORDER — SODIUM CHLORIDE 0.9 % IV SOLN
1.5000 g | Freq: Two times a day (BID) | INTRAVENOUS | Status: DC
  Administered 2014-09-11 – 2014-09-12 (×2): 1.5 g via INTRAVENOUS
  Filled 2014-09-11 (×3): qty 1.5

## 2014-09-11 MED ORDER — INSULIN GLARGINE 100 UNIT/ML ~~LOC~~ SOLN
7.0000 [IU] | Freq: Every day | SUBCUTANEOUS | Status: DC
  Administered 2014-09-11 – 2014-09-16 (×6): 7 [IU] via SUBCUTANEOUS
  Filled 2014-09-11 (×7): qty 0.07

## 2014-09-11 MED ORDER — POTASSIUM CHLORIDE CRYS ER 20 MEQ PO TBCR
40.0000 meq | EXTENDED_RELEASE_TABLET | ORAL | Status: AC
  Administered 2014-09-11 (×2): 40 meq via ORAL
  Filled 2014-09-11 (×2): qty 2

## 2014-09-11 MED ORDER — SODIUM CHLORIDE 0.9 % IV SOLN
INTRAVENOUS | Status: DC
  Administered 2014-09-11: 11:00:00 via INTRAVENOUS

## 2014-09-11 NOTE — Clinical Documentation Improvement (Addendum)
  Tricia Dean is admitted with cellulitis of the face. The follow is documented in the progress note on 11/23: "Low potassium replaced. Please provide the specific medical condition meant by "low potassium".    Thank you for your time with this!   Almon Register, RN Clinical Documentation Improvement Specialist ([phone] / [phone]   Low potassium is self explanatory, please let me know if you wanted me to say hypokalemia instead.

## 2014-09-11 NOTE — Plan of Care (Signed)
Problem: Phase I Progression Outcomes Goal: Pain controlled with appropriate interventions Outcome: Progressing     

## 2014-09-11 NOTE — Progress Notes (Signed)
PT Cancellation Note  Patient Details Name: Tricia Dean MRN: [id number] DOB: 09-05-1936   Cancelled Treatment:    Reason Eval/Treat Not Completed: Patient at procedure or test/unavailable ([name])   Sekai Gitlin, Arrie Aran F 09/11/2014, 3:01 PM [place] Jalissa Heinzelman,PT Acute Rehabilitation [phone] (pager)

## 2014-09-11 NOTE — Progress Notes (Signed)
Inpatient Diabetes Program Recommendations  AACE/ADA: New Consensus Statement on Inpatient Glycemic Control (2013)  Target Ranges:  Prepandial:   less than 140 mg/dL      Peak postprandial:   less than 180 mg/dL (1-2 hours)      Critically ill patients:  140 - 180 mg/dL   Reason for Visit: Diabetes Coordinator Consult regarding insulin therapy at discharge and diabetes education  Diabetes history: Type 2 diabetes Outpatient Diabetes medications: None recently Current orders for Inpatient glycemic control: Lantus 7 units daily, Novolog sensitive scale tid  Note:  Patient has some dementia.  Lives with her daughter.  Daughter states that patient had been on both high blood pressure and diabetes meds in the past, but these meds were stopped because both her blood pressure and blood sugar started "bottoming out".  Daughter questioning why patient may need insulin now.  Explained that the physician possibly thought poorly controlled may have contributed to development of facial cellulitus.  Further explained that an A1C test has been ordered to assessed her glycemic control for the last 2 1/2 to 3 months, but results are not back yet.  (Also explained to daughter how an A1C test measured glycemic control.)  Daughter states they have meter etc to check CBG's at home, but have not checked any since every time she goes to MD everything is always OK.  Daughter states she is not opposed to anything her mother may need, but wants to wait until the A1C test is back to be sure insulin treatment is necessary.  Daughter states she does not want her mother to take anything that would make her "bottom out".    Will ask another Diabetes Coordinator follow-up with patient and daughter tomorrow.  If patient does require insulin at discharge, perhaps the Care Manager to check regarding insurance coverage of insulin pens.  Thank you for the referral.  Aiyden Lauderback S. Marcelline Mates, RN, CNS, CDE Inpatient Diabetes Program, team pager  [phone]

## 2014-09-11 NOTE — Progress Notes (Signed)
[place] for vancomycin, unasyn Indication: cellulitis   Allergies  Allergen Reactions  . Other     "miacin"?  . Penicillins     Patient Measurements: Height: 5\' 2"  (157.5 cm) Weight: 127 lb 9.6 oz (57.879 kg) IBW/kg (Calculated) : 50.1 Adjusted Body Weight:   Vital Signs: Temp: 99.3 F (37.4 C) (11/23 0559) Temp Source: Oral (11/23 0559) BP: 146/40 mmHg (11/23 0559) Pulse Rate: 74 (11/23 0559) Intake/Output from previous day: 11/22 0701 - 11/23 0700 In: 425 [I.V.:225; IV Piggyback:200] Out: -  Intake/Output from this shift: Total I/O In: 360 [P.O.:360] Out: -   Labs:  Recent Labs  09/10/14 1258 09/11/14 0618  WBC 11.6* 11.3*  HGB 11.2* 9.4*  PLT 183 136*  CREATININE 1.53* 1.51*   Medical History: Past Medical History  Diagnosis Date  . Hypertension   . Diabetes mellitus without complication   . Cancer     Renal    Medications:  Prescriptions prior to admission  Medication Sig Dispense Refill Last Dose  . donepezil (ARICEPT) 10 MG tablet Take 10 mg by mouth at bedtime.   09/09/2014 at Unknown time  . omeprazole (PRILOSEC) 40 MG capsule Take 1 capsule (40 mg total) by mouth daily. 30 capsule 1 09/10/2014 at Unknown time  . traZODone (DESYREL) 50 MG tablet Take 50 mg by mouth at bedtime.   09/09/2014 at Unknown time  . feeding supplement (ENSURE COMPLETE) LIQD Take 237 mLs by mouth 2 (two) times daily between meals. (Patient not taking: Reported on 09/10/2014) 30 Bottle 1   . promethazine (PHENERGAN) 25 MG tablet Take 1 tablet (25 mg total) by mouth every 6 (six) hours as needed. For nausea (Patient not taking: Reported on 09/10/2014) 30 tablet 0   . sucralfate (CARAFATE) 1 GM/10ML suspension Take 10 mLs (1 g total) by mouth 4 (four) times daily -  with meals and at bedtime. (Patient not taking: Reported on 09/10/2014) 420 mL 0    Assessment: Cellulitis with swelling, adding Unasyn. Pt with noted penicillin allergy, she is  unsure what type of reaction she had to penicillin. After discussion with MD, will proceed with Unasyn and monitor pt closely to see how she tolerates it. Pt has 1/2 blood cultures with GPC, tmax/24h 102, mild leukocytosis.  Goal of Therapy:  Vancomycin trough level 10-15 mcg/ml  Plan:  -Unasyn 1.5 g IV q12h -Vancomycin 1 g IV q48h -Monitor cultures, renal fx, duration of therapy   Hughes Better, PharmD, BCPS Clinical Pharmacist Pager: [phone] 09/11/2014 11:47 AM

## 2014-09-11 NOTE — Care Management Note (Signed)
    Page 1 of 2   09/17/2014     7:37:17 AM CARE MANAGEMENT NOTE 09/17/2014  Patient:  Tricia Dean, Tricia Dean   Account Number:  [id number]  Date Initiated:  09/11/2014  Documentation initiated by:  Tomi Bamberger  Subjective/Objective Assessment:   dx facial cellulitis, boil  admit- lives with family.     Action/Plan:   i and d in ED.   Anticipated DC Date:  09/16/2014   Anticipated DC Plan:  [place]  CM consult      [facility] Choice  HOME HEALTH   Choice offered to / List presented to:  C-1 Patient        [place] arranged  HH-1 RN  IV Antibiotics      [place].   Status of service:  Completed, signed off Medicare Important Message given?  YES (If response is "NO", the following Medicare IM given date fields will be blank) Date Medicare IM given:  09/12/2014 Medicare IM given by:  Tomi Bamberger Date Additional Medicare IM given:  09/15/2014 Additional Medicare IM given by:  Marylyn Ishihara  Discharge Disposition:  [place]  Per UR Regulation:  Reviewed for med. necessity/level of care/duration of stay  If discussed at [place] of Stay Meetings, dates discussed:    Comments:  09/16/14 11:00 CM retrieved IV ABX and gave to [place] [facility].  Pt set up with [facility] for IV ABX.  [facility] 09/17/14 as last run of vanc was this am.  No other CM needs were communicated.  Mariane Masters, BSN, [place] [phone].  09/13/14 1706 Tomi Bamberger RN BSN [phone] patient will need HHRN for IV ABX for Vacn for 10 more days, plan dc over w/e.  NCM spoke with daughter and she chose [facility], referral made to [facility] with [facility].  Soc will begin 24-48 hrs post dc .  09/11/14 [place] RN, BSN [phone] patient lives with family, NCM will continue to follow for dc needs.

## 2014-09-11 NOTE — Progress Notes (Signed)
Patient Demographics  Tricia Dean, is a 78 y.o. female, DOB - 1936-09-08, UUV:[id number]  Admit date - 09/10/2014   Admitting Physician Domenic Polite, MD  Outpatient Primary MD for the patient is Donnie Coffin, MD  LOS - 1   Chief Complaint  Patient presents with  . Facial Swelling  . Fever        Subjective:   Tricia Dean today has, No headache, No chest pain, No abdominal pain - No Nausea, No new weakness tingling or numbness, No Cough - SOB.    Assessment & Plan    1. L. Facial cellulitis -with IND in the ER, still has significant swelling, will get CT maxillofacial with contrast, continue empiric vancomycin , since his oropharynx area and she is diabetic we'll add Augmentin for gram-negative anaerobic coverage and monitor.   2. Mild dementia. A new supportive care. She is on Aricept, at risk for delirium. Minimize narcotics and benzos.   3. Low potassium replaced. Recheck in the morning.   4. GERD. Continue PPI.   5.DM2 - On ISS and low-dose Lantus for better control. Consult diabetic educator.  No results found for: HGBA1C  CBG (last 3)   Recent Labs  09/10/14 1741 09/10/14 2211 09/11/14 0802  GLUCAP 192* 217* 146*         Code Status: Full  Family Communication: daughter  Disposition Plan: TBD   Procedures CT Max-Facial   Consults     Medications  Scheduled Meds: . donepezil  10 mg Oral QHS  . enoxaparin (LOVENOX) injection  30 mg Subcutaneous Q24H  . insulin aspart  0-9 Units Subcutaneous TID WC  . pantoprazole  40 mg Oral Q1200  . potassium chloride  40 mEq Oral Q4H  . traZODone  50 mg Oral QHS  . [START ON 09/12/2014] vancomycin  1,000 mg Intravenous Q48H   Continuous Infusions: . sodium chloride 50 mL/hr at 09/11/14 1059   PRN  Meds:.acetaminophen, ondansetron  DVT Prophylaxis  Lovenox  *  Lab Results  Component Value Date   PLT 136* 09/11/2014    Antibiotics    Anti-infectives    Start     Dose/Rate Route Frequency Ordered Stop   09/12/14 1600  vancomycin (VANCOCIN) IVPB 1000 mg/200 mL premix     1,000 mg200 mL/hr over 60 Minutes Intravenous Every 48 hours 09/10/14 1415     09/10/14 1445  vancomycin (VANCOCIN) IVPB 1000 mg/200 mL premix     1,000 mg200 mL/hr over 60 Minutes Intravenous  Once 09/10/14 1415 09/10/14 1633          Objective:   Filed Vitals:   09/10/14 1900 09/10/14 2144 09/10/14 2242 09/11/14 0559  BP:   107/34 146/40  Pulse:   84 74  Temp: 100.8 F (38.2 C) 102 F (38.9 C) 100.4 F (38 C) 99.3 F (37.4 C)  TempSrc:  Oral  Oral  Resp:   16 20  Height:      Weight:      SpO2:   97% 99%    Wt Readings from Last 3 Encounters:  09/10/14 57.879 kg (127 lb 9.6 oz)  10/23/12 55.566 kg (122 lb 8 oz)     Intake/Output Summary (Last 24 hours) at 09/11/14 1103 Last data filed  at 09/11/14 1016  Gross per 24 hour  Intake    785 ml  Output      0 ml  Net    785 ml     Physical Exam  Awake Alert, Oriented X 3, No new F.N deficits, Normal affect [place].AT,PERRAL, L facial swelling Supple Neck,No JVD, No cervical lymphadenopathy appriciated.  Symmetrical Chest wall movement, Good air movement bilaterally, CTAB RRR,No Gallops,Rubs or new Murmurs, No Parasternal Heave +ve B.Sounds, Abd Soft, No tenderness, No organomegaly appriciated, No rebound - guarding or rigidity. No Cyanosis, Clubbing or edema, No new Rash or bruise      Data Review   Micro Results Recent Results (from the past 240 hour(s))  Blood culture (routine x 2)     Status: None (Preliminary result)   Collection Time: 09/10/14  2:54 PM  Result Value Ref Range Status   Specimen Description BLOOD RIGHT ARM  Final   Special Requests BOTTLES DRAWN AEROBIC AND ANAEROBIC 5CC  Final   Culture  Setup Time   Final     09/10/2014 21:35 Performed at [place]   Final    [place] [facility] CLUSTERS Note: Gram Stain Report Called to,Read Back By and Verified With: [facility] JACER 09/11/14 1035 BY SMITHERSJ Performed at [facility]    Report Status PENDING  Incomplete  MRSA PCR Screening     Status: Abnormal   Collection Time: 09/10/14  7:15 PM  Result Value Ref Range Status   MRSA by PCR POSITIVE (A) NEGATIVE Final    Comment:        The GeneXpert MRSA Assay (FDA approved for NASAL specimens only), is one component of a comprehensive MRSA colonization surveillance program. It is not intended to diagnose MRSA infection nor to guide or monitor treatment for MRSA infections. RESULT CALLED TO, READ BACK BY AND VERIFIED WITH: Erin Hearing RN 2213 09/10/14 [place]     Radiology Reports No results found.   CBC  Recent Labs Lab 09/10/14 1258 09/11/14 0618  WBC 11.6* 11.3*  HGB 11.2* 9.4*  HCT 34.1* 28.8*  PLT 183 136*  MCV 91.4 89.7  MCH 30.0 29.3  MCHC 32.8 32.6  RDW 13.1 13.5  LYMPHSABS 0.6*  --   MONOABS 0.5  --   EOSABS 0.0  --   BASOSABS 0.0  --     Chemistries   Recent Labs Lab 09/10/14 1258 09/11/14 0618  NA 139 143  K 4.2 3.2*  CL 100 106  CO2 26 23  GLUCOSE 160* 110*  BUN 26* 28*  CREATININE 1.53* 1.51*  CALCIUM 9.5 8.0*  AST 16 15  ALT 9 7  ALKPHOS 88 69  BILITOT 0.8 0.6   ------------------------------------------------------------------------------------------------------------------ estimated creatinine clearance is 25.1 mL/min (by C-G formula based on Cr of 1.51). ------------------------------------------------------------------------------------------------------------------ No results for input(s): HGBA1C in the last 72 hours. ------------------------------------------------------------------------------------------------------------------ No results for input(s): CHOL, HDL, LDLCALC, TRIG, CHOLHDL, LDLDIRECT in the last  72 hours. ------------------------------------------------------------------------------------------------------------------ No results for input(s): TSH, T4TOTAL, T3FREE, THYROIDAB in the last 72 hours.  Invalid input(s): FREET3 ------------------------------------------------------------------------------------------------------------------ No results for input(s): VITAMINB12, FOLATE, FERRITIN, TIBC, IRON, RETICCTPCT in the last 72 hours.  Coagulation profile No results for input(s): INR, PROTIME in the last 168 hours.  No results for input(s): DDIMER in the last 72 hours.  Cardiac Enzymes No results for input(s): CKMB, TROPONINI, MYOGLOBIN in the last 168 hours.  Invalid input(s): CK ------------------------------------------------------------------------------------------------------------------ Invalid input(s): POCBNP     Time Spent  in minutes   35   SINGH,PRASHANT K M.D on 09/11/2014 at 11:03 AM  Between 7am to 7pm - Pager - [phone]  After 7pm go to [email or web address] - password [email or web address]  And look for the night coverage person covering for me after hours  [facility] Office  [phone]

## 2014-09-11 NOTE — Progress Notes (Signed)
Utilization review completed.  

## 2014-09-12 ENCOUNTER — Inpatient Hospital Stay (HOSPITAL_COMMUNITY): Payer: Medicare Other

## 2014-09-12 DIAGNOSIS — I059 Rheumatic mitral valve disease, unspecified: Secondary | ICD-10-CM

## 2014-09-12 DIAGNOSIS — R7881 Bacteremia: Secondary | ICD-10-CM

## 2014-09-12 DIAGNOSIS — E119 Type 2 diabetes mellitus without complications: Secondary | ICD-10-CM

## 2014-09-12 DIAGNOSIS — B9562 Methicillin resistant Staphylococcus aureus infection as the cause of diseases classified elsewhere: Secondary | ICD-10-CM

## 2014-09-12 LAB — CBC
HEMATOCRIT: 30.3 % — AB (ref 36.0–46.0)
Hemoglobin: 9.9 g/dL — ABNORMAL LOW (ref 12.0–15.0)
MCH: 30 pg (ref 26.0–34.0)
MCHC: 32.7 g/dL (ref 30.0–36.0)
MCV: 91.8 fL (ref 78.0–100.0)
Platelets: 138 10*3/uL — ABNORMAL LOW (ref 150–400)
RBC: 3.3 MIL/uL — ABNORMAL LOW (ref 3.87–5.11)
RDW: 13.8 % (ref 11.5–15.5)
WBC: 15.1 10*3/uL — ABNORMAL HIGH (ref 4.0–10.5)

## 2014-09-12 LAB — GLUCOSE, CAPILLARY
GLUCOSE-CAPILLARY: 92 mg/dL (ref 70–99)
Glucose-Capillary: 100 mg/dL — ABNORMAL HIGH (ref 70–99)
Glucose-Capillary: 92 mg/dL (ref 70–99)
Glucose-Capillary: 92 mg/dL (ref 70–99)
Glucose-Capillary: 88 mg/dL (ref 70–99)
Glucose-Capillary: 73 mg/dL (ref 70–99)

## 2014-09-12 LAB — BASIC METABOLIC PANEL
Anion gap: 13 (ref 5–15)
BUN: 28 mg/dL — AB (ref 6–23)
CALCIUM: 8.7 mg/dL (ref 8.4–10.5)
CO2: 21 meq/L (ref 19–32)
CREATININE: 1.41 mg/dL — AB (ref 0.50–1.10)
Chloride: 106 mEq/L (ref 96–112)
GFR calc Af Amer: 41 mL/min — ABNORMAL LOW (ref >90)
GFR calc non Af Amer: 35 mL/min — ABNORMAL LOW (ref >90)
Glucose, Bld: 106 mg/dL — ABNORMAL HIGH (ref 70–99)
Potassium: 4.4 mEq/L (ref 3.7–5.3)
Sodium: 140 mEq/L (ref 137–147)

## 2014-09-12 LAB — CLOSTRIDIUM DIFFICILE BY PCR: CDIFFPCR: NEGATIVE

## 2014-09-12 MED ORDER — VANCOMYCIN HCL IN DEXTROSE 750-5 MG/150ML-% IV SOLN
750.0000 mg | INTRAVENOUS | Status: DC
  Administered 2014-09-12 – 2014-09-16 (×5): 750 mg via INTRAVENOUS
  Filled 2014-09-12 (×5): qty 150

## 2014-09-12 MED ORDER — IBUPROFEN 400 MG PO TABS
400.0000 mg | ORAL_TABLET | Freq: Once | ORAL | Status: AC
  Administered 2014-09-12: 400 mg via ORAL
  Filled 2014-09-12: qty 1

## 2014-09-12 MED ORDER — CEFAZOLIN SODIUM 1-5 GM-% IV SOLN: 1.0000 g | Freq: Three times a day (TID) | INTRAVENOUS | Status: DC

## 2014-09-12 MED ORDER — CHLORHEXIDINE GLUCONATE CLOTH 2 % EX PADS
6.0000 | MEDICATED_PAD | Freq: Every day | CUTANEOUS | Status: DC
  Administered 2014-09-13 – 2014-09-16 (×4): 6 via TOPICAL

## 2014-09-12 MED ORDER — CEFAZOLIN SODIUM-DEXTROSE 2-3 GM-% IV SOLR
2.0000 g | Freq: Two times a day (BID) | INTRAVENOUS | Status: DC
  Administered 2014-09-12: 2 g via INTRAVENOUS
  Filled 2014-09-12 (×2): qty 50

## 2014-09-12 MED ORDER — MUPIROCIN 2 % EX OINT
1.0000 "application " | TOPICAL_OINTMENT | Freq: Two times a day (BID) | CUTANEOUS | Status: DC
  Administered 2014-09-12 – 2014-09-15 (×8): 1 via NASAL
  Filled 2014-09-12 (×2): qty 22

## 2014-09-12 NOTE — Progress Notes (Addendum)
Pt with a cbg of 72. Pt given 8oz of orange juice. Will get tech to recheck cbg and continue to monitor pt. Follow up cbg up to 93. Will continue to monitor pt.

## 2014-09-12 NOTE — Progress Notes (Signed)
Pt is at an independent level in a home-like environment, no further PT needs at this time.  Signing off. 09/12/2014  Donnella Sham, PT [phone]  (pager)

## 2014-09-12 NOTE — Plan of Care (Signed)
Problem: Phase II Progression Outcomes Goal: Discharge plan established Outcome: Progressing Goal: Tolerating diet Outcome: Completed/Met Date Met:  09/12/14

## 2014-09-12 NOTE — Progress Notes (Addendum)
Patient Demographics  Tricia Dean, is a 78 y.o. female, DOB - 1936-05-28, [id number]  Admit date - 09/10/2014   Admitting Physician Domenic Polite, MD  Outpatient Primary MD for the patient is Donnie Coffin, MD  LOS - 2   Chief Complaint  Patient presents with  . Facial Swelling  . Fever        Subjective:   Tricia Dean today has, No headache, No chest pain, No abdominal pain - No Nausea, No new weakness tingling or numbness, No Cough - SOB.    Assessment & Plan    1. L. Facial cellulitis with MRSA bacteremia and sepsis - post incision and drainage done in the ER, still has significant swelling, stable CT maxillofacial with contrast, continue empiric vancomycin and ID input appreciated and cefazolin added,  Monitor. Check TTE. Repeat blood cultures on 09/12/2014. If negative for 48 hours PICC line for a total of 2 weeks of IV antibiotics.   2. Mild dementia. A new supportive care. She is on Aricept, at risk for delirium. Minimize narcotics and benzos.   3. Low potassium replaced. Recheck in the morning.   4. GERD. Continue PPI.   5.DM2 - On ISS and low-dose Lantus for better control. Consult diabetic educator.  Lab Results  Component Value Date   HGBA1C 6.0* 09/11/2014    CBG (last 3)   Recent Labs  09/11/14 1643 09/11/14 2229 09/12/14 0752  GLUCAP 98 105* 100*         Code Status: Full  Family Communication: daughter  Disposition Plan: TBD   Procedures CT Max-Facial, TTE   Consults  ID   Medications  Scheduled Meds: .  ceFAZolin (ANCEF) IV  2 g Intravenous Q12H  . donepezil  10 mg Oral QHS  . enoxaparin (LOVENOX) injection  30 mg Subcutaneous Q24H  . insulin aspart  0-9 Units Subcutaneous TID WC  . insulin glargine  7 Units Subcutaneous Daily    . pantoprazole  40 mg Oral Q1200  . traZODone  50 mg Oral QHS  . vancomycin  750 mg Intravenous Q24H   Continuous Infusions:   PRN Meds:.acetaminophen, ondansetron  DVT Prophylaxis  Lovenox    Lab Results  Component Value Date   PLT 138* 09/12/2014    Antibiotics    Anti-infectives    Start     Dose/Rate Route Frequency Ordered Stop   09/12/14 1600  vancomycin (VANCOCIN) IVPB 1000 mg/200 mL premix  Status:  Discontinued     1,000 mg200 mL/hr over 60 Minutes Intravenous Every 48 hours 09/10/14 1415 09/12/14 0827   09/12/14 1400  ceFAZolin (ANCEF) IVPB 1 g/50 mL premix  Status:  Discontinued     1 g100 mL/hr over 30 Minutes Intravenous 3 times per day 09/12/14 1043 09/12/14 1044   09/12/14 1130  ceFAZolin (ANCEF) IVPB 2 g/50 mL premix     2 g100 mL/hr over 30 Minutes Intravenous Every 12 hours 09/12/14 1044     09/12/14 0900  vancomycin (VANCOCIN) IVPB 750 mg/150 ml premix     750 mg150 mL/hr over 60 Minutes Intravenous Every 24 hours 09/12/14 0827     09/11/14 1300  ampicillin-sulbactam (UNASYN) 1.5 g in sodium chloride 0.9 % 50 mL IVPB  Status:  Discontinued  1.5 g100 mL/hr over 30 Minutes Intravenous Every 12 hours 09/11/14 1144 09/12/14 1043   09/10/14 1445  vancomycin (VANCOCIN) IVPB 1000 mg/200 mL premix     1,000 mg200 mL/hr over 60 Minutes Intravenous  Once 09/10/14 1415 09/10/14 1633          Objective:   Filed Vitals:   09/11/14 1653 09/11/14 1753 09/11/14 2150 09/12/14 0518  BP: 130/41  130/44 141/51  Pulse: 85  69 78  Temp: 103.1 F (39.5 C) 100.3 F (37.9 C) 99.3 F (37.4 C) 98.7 F (37.1 C)  TempSrc: Oral Oral Oral Oral  Resp: 18  18 18   Height:      Weight:      SpO2: 96%  98% 97%    Wt Readings from Last 3 Encounters:  09/10/14 57.879 kg (127 lb 9.6 oz)  10/23/12 55.566 kg (122 lb 8 oz)     Intake/Output Summary (Last 24 hours) at 09/12/14 1059 Last data filed at 09/12/14 1028  Gross per 24 hour  Intake    480 ml  Output    301 ml   Net    179 ml     Physical Exam  Awake Alert, Oriented X 3, No new F.N deficits, Normal affect [place].AT,PERRAL, L facial swelling Supple Neck,No JVD, No cervical lymphadenopathy appriciated.  Symmetrical Chest wall movement, Good air movement bilaterally, CTAB RRR,No Gallops,Rubs or new Murmurs, No Parasternal Heave +ve B.Sounds, Abd Soft, No tenderness, No organomegaly appriciated, No rebound - guarding or rigidity. No Cyanosis, Clubbing or edema, No new Rash or bruise      Data Review   Micro Results Recent Results (from the past 240 hour(s))  Blood culture (routine x 2)     Status: None (Preliminary result)   Collection Time: 09/10/14  2:54 PM  Result Value Ref Range Status   Specimen Description BLOOD RIGHT ARM  Final   Special Requests BOTTLES DRAWN AEROBIC AND ANAEROBIC 5CC  Final   Culture  Setup Time   Final    09/10/2014 21:35 Performed at [facility]    Culture   Final    STAPHYLOCOCCUS AUREUS Note: RIFAMPIN AND GENTAMICIN SHOULD NOT BE USED AS SINGLE DRUGS FOR TREATMENT OF STAPH INFECTIONS. Note: Gram Stain Report Called to,Read Back By and Verified With: [facility] JACER 09/11/14 1035 BY SMITHERSJ Performed at [facility]    Report Status PENDING  Incomplete  Blood culture (routine x 2)     Status: None (Preliminary result)   Collection Time: 09/10/14  2:59 PM  Result Value Ref Range Status   Specimen Description BLOOD LEFT FOREARM  Final   Special Requests BOTTLES DRAWN AEROBIC AND ANAEROBIC 5CC  Final   Culture  Setup Time   Final    09/10/2014 21:35 Performed at [facility]    Culture   Final    STAPHYLOCOCCUS AUREUS Note: Gram Stain Report Called to,Read Back By and Verified With: Lavone Nian 09/11/14 BY SMITHERSJ Performed at [facility]    Report Status PENDING  Incomplete  MRSA PCR Screening     Status: Abnormal   Collection Time: 09/10/14  7:15 PM  Result Value Ref Range Status   MRSA by PCR POSITIVE (A) NEGATIVE  Final    Comment:        The GeneXpert MRSA Assay (FDA approved for NASAL specimens only), is one component of a comprehensive MRSA colonization surveillance program. It is not intended to diagnose MRSA infection nor to guide or monitor  treatment for MRSA infections. RESULT CALLED TO, READ BACK BY AND VERIFIED WITH: WILEY,N RN 2213 09/10/14 MITCHELL,L   Clostridium Difficile by PCR     Status: None   Collection Time: 09/12/14  2:40 AM  Result Value Ref Range Status   C difficile by pcr NEGATIVE NEGATIVE Final    Radiology Reports Ct Maxillofacial W/cm  09/11/2014   CLINICAL DATA:  Left facial swelling and redness  EXAM: CT MAXILLOFACIAL WITH CONTRAST  TECHNIQUE: Multidetector CT imaging of the maxillofacial structures was performed with intravenous contrast. Multiplanar CT image reconstructions were also generated. A small metallic BB was placed on the right temple in order to reliably differentiate right from left.  CONTRAST:  53mL OMNIPAQUE IOHEXOL 300 MG/ML  SOLN  COMPARISON:  None.  FINDINGS: Skull base is contents are within normal limits. The orbits and their contents are unremarkable.  In the left cheek and jaw region, there is skin thickening and subcutaneous edema identified. No focal subcutaneous abscess is noted. No definitive bony erosion to suggest underlying osteomyelitis is noted. The visualized cervical spine is unremarkable. Carotid calcifications are seen. A small air-fluid level is noted within the left maxillary antrum. No other bony abnormality is noted.  IMPRESSION: Changes consistent with facial cellulitis on the left in the region of the cheek and jaw. No areas to suggest subcutaneous abscess are noted. No bony erosion is seen   Electronically Signed   By: Inez Catalina M.D.   On: 09/11/2014 13:56     CBC  Recent Labs Lab 09/10/14 1258 09/11/14 0618 09/12/14 0535  WBC 11.6* 11.3* 15.1*  HGB 11.2* 9.4* 9.9*  HCT 34.1* 28.8* 30.3*  PLT 183 136* 138*  MCV 91.4  89.7 91.8  MCH 30.0 29.3 30.0  MCHC 32.8 32.6 32.7  RDW 13.1 13.5 13.8  LYMPHSABS 0.6*  --   --   MONOABS 0.5  --   --   EOSABS 0.0  --   --   BASOSABS 0.0  --   --     Chemistries   Recent Labs Lab 09/10/14 1258 09/11/14 0618 09/12/14 0535  NA 139 143 140  K 4.2 3.2* 4.4  CL 100 106 106  CO2 26 23 21   GLUCOSE 160* 110* 106*  BUN 26* 28* 28*  CREATININE 1.53* 1.51* 1.41*  CALCIUM 9.5 8.0* 8.7  AST 16 15  --   ALT 9 7  --   ALKPHOS 88 69  --   BILITOT 0.8 0.6  --    ------------------------------------------------------------------------------------------------------------------ estimated creatinine clearance is 26.8 mL/min (by C-G formula based on Cr of 1.41). ------------------------------------------------------------------------------------------------------------------  Recent Labs  09/10/14 1835 09/11/14 1130  HGBA1C 5.9* 6.0*   ------------------------------------------------------------------------------------------------------------------ No results for input(s): CHOL, HDL, LDLCALC, TRIG, CHOLHDL, LDLDIRECT in the last 72 hours. ------------------------------------------------------------------------------------------------------------------ No results for input(s): TSH, T4TOTAL, T3FREE, THYROIDAB in the last 72 hours.  Invalid input(s): FREET3 ------------------------------------------------------------------------------------------------------------------ No results for input(s): VITAMINB12, FOLATE, FERRITIN, TIBC, IRON, RETICCTPCT in the last 72 hours.  Coagulation profile No results for input(s): INR, PROTIME in the last 168 hours.  No results for input(s): DDIMER in the last 72 hours.  Cardiac Enzymes No results for input(s): CKMB, TROPONINI, MYOGLOBIN in the last 168 hours.  Invalid input(s): CK ------------------------------------------------------------------------------------------------------------------ Invalid input(s):  POCBNP     Time Spent in minutes   35   Mitzy Naron K M.D on 09/12/2014 at 10:59 AM  Between 7am to 7pm - Pager - [phone]  After 7pm go to [email or web address] - password [facility]  And look for the night coverage person covering for me after hours  [facility] Office  [phone]

## 2014-09-12 NOTE — Progress Notes (Addendum)
Inpatient Diabetes Program Recommendations  AACE/ADA: New Consensus Statement on Inpatient Glycemic Control (2013)  Target Ranges:  Prepandial:   less than 140 mg/dL      Peak postprandial:   less than 180 mg/dL (1-2 hours)      Critically ill patients:  140 - 180 mg/dL   Results for Tricia Dean, Tricia Dean (MRN [id number]) as of 09/12/2014 10:35  Ref. Range 09/11/2014 08:02 09/11/2014 11:47 09/11/2014 16:43 09/11/2014 22:29 09/12/2014 07:52  Glucose-Capillary Latest Range: 70-99 mg/dL 146 (H) 106 (H) 98 105 (H) 100 (H)  Results for Tricia, Dean (MRN [id number]) as of 09/12/2014 10:35  Ref. Range 09/10/2014 18:35 09/11/2014 11:30  Hgb A1c MFr Bld Latest Range: <5.7 % 5.9 (H) 6.0 (H)   Diabetes history: DM2 Outpatient Diabetes medications: None Current orders for Inpatient glycemic control: Lantus 7 units QHS, Novolog 0-9 units AC  Inpatient Diabetes Program Recommendations Insulin - Basal: Noted Lantus 7 units ordered 09/11/14 at 11:07 am. However, Lantus 7 units was not given on 11/23 until 18:04. Noted patient has already received Lantus 7 units this morning at 9:48. Concerned patient by experience hypoglycemia due to receiving Lantus doses 15 hours apart especially given CBGs have been trending 98-146 mg/dl over the past 30 hours  Note:  Noted patient has received 2 doses of Lantus 7 units 15 hours apart. Tricia Fetch, RN caring for patient today to make her aware of Lantus administration times and to alert her to increased risk of hypoglycemia. Also asked that she discuss discharge plan for glycemic control as A1C is 6.0%.   Diabetes Coordinator talked with the patient and her daughter yesterday regarding insulin. According to the notes, "Daughter states she is not opposed to anything her mother may need, but wants to wait until the A1C test is back to be sure insulin treatment is necessary. Daughter states she does not want her mother to take anything that would make her "bottom out". A1C  was 6.0% on 09/11/14. MD-Please provide discharge plan for glycemic control (will insulin be necessary given A1C of 6.0% and risk of hypoglycemia in elderly patient with history of dementia).    09/12/14@13 :68- Talked with Tricia Dean (patient's daughter) and she reports that the patient is followed by Dr. Donnie Coffin at [facility] for primary care. According to [facility], Dr. Alroy Dust is following glycemic control and has told her that her mother did not need any medication for diabetes. In reviewing the chart, noted note from Mar 05, 2011 and patient was prescribed Metformin 850 mg BID, Glyburide 5 mg BID, and Actos 30 mg daily at that time. Tricia Dean reports that all oral diabetes medications were stopped in January 2014 due to "blood sugar bottoming out". Tricia Dean is concerned about using insulin with her mother due to increased risk of low blood sugars and dementia. Plan to page Dr. Candiss Norse to discuss discharge plan for glycemic control.  09/12/14@13 :39-Discussed discharge plan for glycemic control with Dr. Candiss Norse and at this point the plan will be to discharge on oral diabetes medications. Informed patient's daughter Tricia Dean [phone]) that insulin will not be used as an outpatient and MD plans to prescribe oral DM medication at discharge.  Thanks, Barnie Alderman, RN, MSN, CCRN, CDE Diabetes Coordinator Inpatient Diabetes Program [phone] (Team Pager) [phone] ([facility] office) [phone] [facility] office)

## 2014-09-12 NOTE — Evaluation (Signed)
Physical Therapy Evaluation Patient Details Name: JANAYSIA MCLEROY MRN: [id number] DOB: 1936/09/09 Today's Date: 09/12/2014   History of Present Illness  Pt is a 44^ ;y/o female admitted with vomiting fever and facial cellulitis including a boil s/p I and D.    Clinical Impression  Pt's mobility is at independent level in a home-like environment and at pt's baseline.  No further PT needs.  Will sign off.    Follow Up Recommendations No PT follow up    Equipment Recommendations  None recommended by PT    Recommendations for Other Services       Precautions / Restrictions Precautions Precautions: None      Mobility  Bed Mobility Overal bed mobility: Independent                Transfers Overall transfer level: Independent                  Ambulation/Gait Ambulation/Gait assistance: Independent Ambulation Distance (Feet): 350 Feet Assistive device: None Gait Pattern/deviations: Step-through pattern   Gait velocity interpretation: at or above normal speed for age/gender General Gait Details: steady and fluid gait.  Able to scan, turn and step over objects without deviation.  Stairs Stairs: Yes Stairs assistance: Modified independent (Device/Increase time) Stair Management: One rail Left;Alternating pattern;Forwards Number of Stairs: 5    Wheelchair Mobility    Modified Rankin (Stroke Patients Only)       Balance Overall balance assessment: No apparent balance deficits (not formally assessed)                                           Pertinent Vitals/Pain Pain Assessment: No/denies pain    Home Living Family/patient expects to be discharged to:: Private residence Living Arrangements: Children Available Help at Discharge: Family;Available PRN/intermittently Type of Home: Other(Comment) (Double wide/modular) Home Access: Stairs to enter Entrance Stairs-Rails: Right Entrance Stairs-Number of Steps: 4 Home Layout: One  level Home Equipment: None      Prior Function Level of Independence: Independent               Hand Dominance        Extremity/Trunk Assessment               Lower Extremity Assessment: Overall WFL for tasks assessed      Cervical / Trunk Assessment: Normal  Communication   Communication: No difficulties  Cognition Arousal/Alertness: Awake/alert Behavior During Therapy: WFL for tasks assessed/performed Overall Cognitive Status: Within Functional Limits for tasks assessed                      General Comments      Exercises        Assessment/Plan    PT Assessment Patient needs continued PT services  PT Diagnosis Difficulty walking   PT Problem List    PT Treatment Interventions     PT Goals (Current goals can be found in the Care Plan section) Acute Rehab PT Goals PT Goal Formulation: All assessment and education complete, DC therapy    Frequency     Barriers to discharge        Co-evaluation               End of Session   Activity Tolerance: Patient tolerated treatment well Patient left: in bed;with call bell/phone within reach;with family/visitor present Nurse Communication: Mobility status  Time: 0340-3524 PT Time Calculation (min) (ACUTE ONLY): 18 min   Charges:   PT Evaluation $Initial PT Evaluation Tier I: 1 Procedure PT Treatments $Gait Training: 8-22 mins   PT G Codes:          Julyssa Kyer, Tessie Fass 09/12/2014, 10:17 AM 09/12/2014  Donnella Sham, [place] [phone]  (pager)

## 2014-09-12 NOTE — Progress Notes (Signed)
  Echocardiogram 2D Echocardiogram has been performed.  Darlina Sicilian M 09/12/2014, 1:24 PM

## 2014-09-12 NOTE — Progress Notes (Signed)
Formal recs to follow  Will change antibiotics to cefazolin plus vancomycin and discontinue amp/sub to treat staph aureus bacteremia with associated facial cellulitis

## 2014-09-12 NOTE — Consult Note (Addendum)
[facility] for Infectious Disease  Total days of antibiotics 3        Day 3 vanco        Day 1 ampsub        Day 1 cefazolin       Reason for Consult: mrsa bacteremia and cellulitis     Referring Physician: singh  Principal Problem:   Facial cellulitis Active Problems:   Hypertension   Diabetes mellitus   Renal cell cancer   Cellulitis    HPI: Tricia Dean is a 78 y.o. female with hx of DM, HTN, who was admitted on 11/22 for fever and left sided facial cellulitis that had quickly progressed from small boil to swelling of lower face and lip. She reports having previous MRSA skin infection in the past requiring hospitalization. She was found to be febrile to 103F. Wbc of 11.6 with leftshift of 90%N. Patient also developed nausea and vomiting and high fevers today. Denies any sore throat. No chest pain or shortness of breath or difficulty swallowing. She had boil unroofed in the ED with purulent drainage. She was started on IV vancomycin. Underwent facial CT that did not show any further abscess. She had blood cx drawn and turned positive at 48hr, found to have 2/2 sets + MRSA. Since starting antibiotics, her facial swelling and erythema has improved.  Past Medical History  Diagnosis Date  . Hypertension   . Diabetes mellitus without complication   . Cancer     Renal    Allergies:  Allergies  Allergen Reactions  . Other     "miacin"?  . Penicillins     Tolerates Unasyn     MEDICATIONS: . [START ON 09/13/2014] Chlorhexidine Gluconate Cloth  6 each Topical Q0600  . donepezil  10 mg Oral QHS  . enoxaparin (LOVENOX) injection  30 mg Subcutaneous Q24H  . insulin aspart  0-9 Units Subcutaneous TID WC  . insulin glargine  7 Units Subcutaneous Daily  . mupirocin ointment  1 application Nasal BID  . pantoprazole  40 mg Oral Q1200  . traZODone  50 mg Oral QHS  . vancomycin  750 mg Intravenous Q24H    History  Substance Use Topics  . Smoking status: Never Smoker     . Smokeless tobacco: Not on file  . Alcohol Use: No    Family History  Problem Relation Age of Onset  . Hypertension       Review of Systems  Constitutional: Negative for fever, chills, diaphoresis, activity change, appetite change, fatigue and unexpected weight change.  HENT: Negative for congestion, sore throat, rhinorrhea, sneezing, trouble swallowing and sinus pressure.  Eyes: Negative for photophobia and visual disturbance.  Respiratory: Negative for cough, chest tightness, shortness of breath, wheezing and stridor.  Cardiovascular: Negative for chest pain, palpitations and leg swelling.  Gastrointestinal: Negative for nausea, vomiting, abdominal pain, diarrhea, constipation, blood in stool, abdominal distention and anal bleeding.  Genitourinary: Negative for dysuria, hematuria, flank pain and difficulty urinating.  Musculoskeletal: Negative for myalgias, back pain, joint swelling, arthralgias and gait problem.  Skin: Negative for color change, pallor, rash and wound.  Neurological: Negative for dizziness, tremors, weakness and light-headedness.  Hematological: Negative for adenopathy. Does not bruise/bleed easily.  Psychiatric/Behavioral: Negative for behavioral problems, confusion, sleep disturbance, dysphoric mood, decreased concentration and agitation.     OBJECTIVE: Temp:  [98.6 F (37 C)-103.1 F (39.5 C)] 98.6 F (37 C) (11/24 1334) Pulse Rate:  [61-85] 61 (11/24 1334) Resp:  [  18-28] 28 (11/24 1334) BP: (130-141)/(41-51) 138/41 mmHg (11/24 1334) SpO2:  [96 %-99 %] 99 % (11/24 1334) Physical Exam  Constitutional:  oriented to person, place, and time. appears well-developed and well-nourished. No distress.  HENT:  Mouth/Throat: Oropharynx is clear and moist. No oropharyngeal exudate.  Skin: Skin is warm and dry. Left lower quarter of face is purplish hue, left lower cheek has healing boil site. some swelling noted to nasal fold, left upper lip and lower chin, with  erythema extending to neck. Still has swelling, whitish/purulent area to left upper lip near the corner of mouth .tender to touch, not actively draining with pressure Cardiovascular: Normal rate, regular rhythm and normal heart sounds. Exam reveals no gallop and no friction rub.  No murmur heard.  Pulmonary/Chest: Effort normal and breath sounds normal. No respiratory distress.  has no wheezes.  Abdominal: Soft. Bowel sounds are normal.  exhibits no distension. There is no tenderness.  Lymphadenopathy: no cervical adenopathy.  Neurological: alert and oriented to person, place, and time.    LABS: Results for orders placed or performed during the hospital encounter of 09/10/14 (from the past 48 hour(s))  Glucose, capillary     Status: Abnormal   Collection Time: 09/10/14  5:41 PM  Result Value Ref Range   Glucose-Capillary 192 (H) 70 - 99 mg/dL  Hemoglobin A1c     Status: Abnormal   Collection Time: 09/10/14  6:35 PM  Result Value Ref Range   Hgb A1c MFr Bld 5.9 (H) <5.7 %    Comment: (NOTE)                                                                       According to the ADA Clinical Practice Recommendations for 2011, when HbA1c is used as a screening test:  >=6.5%   Diagnostic of Diabetes Mellitus           (if abnormal result is confirmed) 5.7-6.4%   Increased risk of developing Diabetes Mellitus References:Diagnosis and Classification of Diabetes Mellitus,Diabetes AVWU,9811,91(YNWGN 1):S62-S69 and Standards of Medical Care in         Diabetes - 2011,Diabetes Care,2011,34 (Suppl 1):S11-S61.    Mean Plasma Glucose 123 (H) <117 mg/dL    Comment: Performed at [facility] PCR Screening     Status: Abnormal   Collection Time: 09/10/14  7:15 PM  Result Value Ref Range   MRSA by PCR POSITIVE (A) NEGATIVE    Comment:        The GeneXpert MRSA Assay (FDA approved for NASAL specimens only), is one component of a comprehensive MRSA colonization surveillance program.  It is not intended to diagnose MRSA infection nor to guide or monitor treatment for MRSA infections. RESULT CALLED TO, READ BACK BY AND VERIFIED WITH: WILEY,N RN [id number] 09/10/14 MITCHELL,L   Glucose, capillary     Status: Abnormal   Collection Time: 09/10/14 10:11 PM  Result Value Ref Range   Glucose-Capillary 217 (H) 70 - 99 mg/dL  Comprehensive metabolic panel     Status: Abnormal   Collection Time: 09/11/14  6:18 AM  Result Value Ref Range   Sodium 143 137 - 147 mEq/L   Potassium 3.2 (L) 3.7 - 5.3 mEq/L   Chloride 106  96 - 112 mEq/L   CO2 23 19 - 32 mEq/L   Glucose, Bld 110 (H) 70 - 99 mg/dL   BUN 28 (H) 6 - 23 mg/dL   Creatinine, Ser 1.51 (H) 0.50 - 1.10 mg/dL   Calcium 8.0 (L) 8.4 - 10.5 mg/dL   Total Protein 6.3 6.0 - 8.3 g/dL   Albumin 2.8 (L) 3.5 - 5.2 g/dL   AST 15 0 - 37 U/L   ALT 7 0 - 35 U/L   Alkaline Phosphatase 69 39 - 117 U/L   Total Bilirubin 0.6 0.3 - 1.2 mg/dL   GFR calc non Af Amer 32 (L) >90 mL/min   GFR calc Af Amer 38 (L) >90 mL/min    Comment: (NOTE) The eGFR has been calculated using the CKD EPI equation. This calculation has not been validated in all clinical situations. eGFR's persistently <90 mL/min signify possible Chronic Kidney Disease.    Anion gap 14 5 - 15  CBC     Status: Abnormal   Collection Time: 09/11/14  6:18 AM  Result Value Ref Range   WBC 11.3 (H) 4.0 - 10.5 K/uL   RBC 3.21 (L) 3.87 - 5.11 MIL/uL   Hemoglobin 9.4 (L) 12.0 - 15.0 g/dL    Comment: REPEATED TO VERIFY   HCT 28.8 (L) 36.0 - 46.0 %   MCV 89.7 78.0 - 100.0 fL   MCH 29.3 26.0 - 34.0 pg   MCHC 32.6 30.0 - 36.0 g/dL   RDW 13.5 11.5 - 15.5 %   Platelets 136 (L) 150 - 400 K/uL    Comment: REPEATED TO VERIFY  Glucose, capillary     Status: Abnormal   Collection Time: 09/11/14  8:02 AM  Result Value Ref Range   Glucose-Capillary 146 (H) 70 - 99 mg/dL  Hemoglobin A1c     Status: Abnormal   Collection Time: 09/11/14 11:30 AM  Result Value Ref Range   Hgb A1c MFr Bld  6.0 (H) <5.7 %    Comment: (NOTE)                                                                       According to the ADA Clinical Practice Recommendations for 2011, when HbA1c is used as a screening test:  >=6.5%   Diagnostic of Diabetes Mellitus           (if abnormal result is confirmed) 5.7-6.4%   Increased risk of developing Diabetes Mellitus References:Diagnosis and Classification of Diabetes Mellitus,Diabetes HFWY,6378,58(IFOYD 1):S62-S69 and Standards of Medical Care in         Diabetes - 2011,Diabetes Care,2011,34 (Suppl 1):S11-S61.    Mean Plasma Glucose 126 (H) <117 mg/dL    Comment: Performed at [facility]  Glucose, capillary     Status: Abnormal   Collection Time: 09/11/14 11:47 AM  Result Value Ref Range   Glucose-Capillary 106 (H) 70 - 99 mg/dL  Glucose, capillary     Status: None   Collection Time: 09/11/14  4:43 PM  Result Value Ref Range   Glucose-Capillary 98 70 - 99 mg/dL  Glucose, capillary     Status: Abnormal   Collection Time: 09/11/14 10:29 PM  Result Value Ref Range   Glucose-Capillary 105 (H) 70 -  99 mg/dL  Clostridium Difficile by PCR     Status: None   Collection Time: 09/12/14  2:40 AM  Result Value Ref Range   C difficile by pcr NEGATIVE NEGATIVE  Basic metabolic panel     Status: Abnormal   Collection Time: 09/12/14  5:35 AM  Result Value Ref Range   Sodium 140 137 - 147 mEq/L   Potassium 4.4 3.7 - 5.3 mEq/L    Comment: NO VISIBLE HEMOLYSIS   Chloride 106 96 - 112 mEq/L   CO2 21 19 - 32 mEq/L   Glucose, Bld 106 (H) 70 - 99 mg/dL   BUN 28 (H) 6 - 23 mg/dL   Creatinine, Ser 1.41 (H) 0.50 - 1.10 mg/dL   Calcium 8.7 8.4 - 10.5 mg/dL   GFR calc non Af Amer 35 (L) >90 mL/min   GFR calc Af Amer 41 (L) >90 mL/min    Comment: (NOTE) The eGFR has been calculated using the CKD EPI equation. This calculation has not been validated in all clinical situations. eGFR's persistently <90 mL/min signify possible Chronic Kidney Disease.     Anion gap 13 5 - 15  CBC     Status: Abnormal   Collection Time: 09/12/14  5:35 AM  Result Value Ref Range   WBC 15.1 (H) 4.0 - 10.5 K/uL   RBC 3.30 (L) 3.87 - 5.11 MIL/uL   Hemoglobin 9.9 (L) 12.0 - 15.0 g/dL   HCT 30.3 (L) 36.0 - 46.0 %   MCV 91.8 78.0 - 100.0 fL   MCH 30.0 26.0 - 34.0 pg   MCHC 32.7 30.0 - 36.0 g/dL   RDW 13.8 11.5 - 15.5 %   Platelets 138 (L) 150 - 400 K/uL  Glucose, capillary     Status: Abnormal   Collection Time: 09/12/14  7:52 AM  Result Value Ref Range   Glucose-Capillary 100 (H) 70 - 99 mg/dL  Glucose, capillary     Status: None   Collection Time: 09/12/14 12:35 PM  Result Value Ref Range   Glucose-Capillary 92 70 - 99 mg/dL  Glucose, capillary     Status: None   Collection Time: 09/12/14 12:51 PM  Result Value Ref Range   Glucose-Capillary 92 70 - 99 mg/dL    MICRO: 11/22 blood cx MRSA and SA (species still pending) 11/24 blood cx pending IMAGING: Dg Chest 2 View  09/12/2014   CLINICAL DATA:  Several day history of cough and generalized weakness. Current history of hypertension and diabetes. Current history of mixed cystic and solid left lower pole renal mass likely renal cell carcinoma.  EXAM: CHEST  2 VIEW  COMPARISON:  Two-view chest x-ray 02/15/2014. CT chest 11/12/2012. One view chest x-ray 10/23/2012.  FINDINGS: AP erect and lateral images were obtained. Suboptimal inspiration accounts for crowded bronchovascular markings, especially in the bases, and accentuates the cardiac silhouette. Vague focal opacities projected over the right mid lung and right base on the AP image, not clearly localized on the lateral, not present on the 01/2014 examination. Lungs otherwise clear. Bronchovascular markings normal. Pulmonary vascularity normal. No pleural effusions. No pneumothorax. Cardiac silhouette upper normal in size to slightly enlarged for technique and degree of inspiration, unchanged. Thoracic aorta atherosclerotic, unchanged. Degenerative changes  involving the thoracic spine.  IMPRESSION: 1. Suboptimal inspiration. Patchy pneumonia in the right lung is suspected. Followup chest x-ray after treatment is suggested to be sure the opacities resolved. 2. Stable borderline heart size. No acute cardiopulmonary disease otherwise.   Electronically Signed  By: Evangeline Dakin M.D.   On: 09/12/2014 11:41   Ct Maxillofacial W/cm  09/11/2014   CLINICAL DATA:  Left facial swelling and redness  EXAM: CT MAXILLOFACIAL WITH CONTRAST  TECHNIQUE: Multidetector CT imaging of the maxillofacial structures was performed with intravenous contrast. Multiplanar CT image reconstructions were also generated. A small metallic BB was placed on the right temple in order to reliably differentiate right from left.  CONTRAST:  27m OMNIPAQUE IOHEXOL 300 MG/ML  SOLN  COMPARISON:  None.  FINDINGS: Skull base is contents are within normal limits. The orbits and their contents are unremarkable.  In the left cheek and jaw region, there is skin thickening and subcutaneous edema identified. No focal subcutaneous abscess is noted. No definitive bony erosion to suggest underlying osteomyelitis is noted. The visualized cervical spine is unremarkable. Carotid calcifications are seen. A small air-fluid level is noted within the left maxillary antrum. No other bony abnormality is noted.  IMPRESSION: Changes consistent with facial cellulitis on the left in the region of the cheek and jaw. No areas to suggest subcutaneous abscess are noted. No bony erosion is seen   Electronically Signed   By: MInez CatalinaM.D.   On: 09/11/2014 13:56    Assessment/Plan:  89yoF with MRSA bacteremia and facial cellulitis       [facility] Staphylococcus aureus bacteremia   Staphylococcus aureus bacteremia (SAB) is associated with a high rate of complications and mortality.  Specific aspects of clinical management are critical to optimizing the outcome of patients with SAB.   Therefore, [facility] ([facility] has initiated an intervention aimed at [facility] the [facility]  To do so, Infectious Diseases physicians are providing an evidence-based consult for the management of all patients with SAB.     Yes No Comments  Perform follow-up blood cultures (even if the patient is afebrile) to ensure clearance of bacteremia _0  _1  11/24       Perform echocardiography to evaluate for endocarditis (transthoracic ECHO is 40-50% sensitive, TEE is > 90% sensitive) _2  _3  TTE on 11/24       Ensure source control _4  _5  Small i x d of facial boil, may need warm compress  Vs. Small incision to left upper lip since it still appears to have small abscess to facilitate draining  Investigate for "metastatic" sites of infection _6  _7  Facial CT no other abscess  Continue antibiotic therapy to: vancomycin _8  _9   Vancomycin, goal trough should be 15 - 20 mcg/mL  Estimated duration of IV antibiotic therapy:  Minimum of 2wk. Await TTE results _10  _11  Consult case management for probably prolonged outpatient IV antibiotic therapy   Tricia Dean B. [facility]ious Diseases [phone]

## 2014-09-13 DIAGNOSIS — R7881 Bacteremia: Secondary | ICD-10-CM | POA: Insufficient documentation

## 2014-09-13 DIAGNOSIS — K5900 Constipation, unspecified: Secondary | ICD-10-CM

## 2014-09-13 DIAGNOSIS — K1379 Other lesions of oral mucosa: Secondary | ICD-10-CM

## 2014-09-13 DIAGNOSIS — L0201 Cutaneous abscess of face: Secondary | ICD-10-CM

## 2014-09-13 LAB — GLUCOSE, CAPILLARY
GLUCOSE-CAPILLARY: 79 mg/dL (ref 70–99)
GLUCOSE-CAPILLARY: 84 mg/dL (ref 70–99)
GLUCOSE-CAPILLARY: 97 mg/dL (ref 70–99)
Glucose-Capillary: 72 mg/dL (ref 70–99)

## 2014-09-13 LAB — CULTURE, BLOOD (ROUTINE X 2)

## 2014-09-13 MED ORDER — LIDOCAINE-EPINEPHRINE 2 %-1:100000 IJ SOLN
20.0000 mL | Freq: Once | INTRAMUSCULAR | Status: AC
  Administered 2014-09-13: 20 mL
  Filled 2014-09-13: qty 20

## 2014-09-13 MED ORDER — MORPHINE SULFATE 2 MG/ML IJ SOLN
2.0000 mg | INTRAMUSCULAR | Status: DC | PRN
  Administered 2014-09-13 – 2014-09-15 (×2): 2 mg via INTRAVENOUS
  Filled 2014-09-13 (×2): qty 1

## 2014-09-13 NOTE — Consult Note (Signed)
Reason for Consult:left lip abscess Referring Physician: hospuitalist  Tricia Dean is an 78 y.o. female.  HPI: hx of a swelling in the left face about one week ago. It enlarged and became very painful. She was admitted on Sunday and started on Iv abx with dx of MRSA bacteremia.  She has substantially improved with the cheek swelling and pain. She now has an area on the left upper lip that has persisted with swelling.   Past Medical History  Diagnosis Date  . Hypertension   . Diabetes mellitus without complication   . Cancer     Renal    Past Surgical History  Procedure Laterality Date  . Abdominal hysterectomy      Family History  Problem Relation Age of Onset  . Hypertension      Social History:  reports that she has never smoked. She does not have any smokeless tobacco history on file. She reports that she does not drink alcohol or use illicit drugs.  Allergies:  Allergies  Allergen Reactions  . Other     "miacin"?  . Penicillins     Tolerates Unasyn     Medications: I have reviewed the patient's current medications.  Results for orders placed or performed during the hospital encounter of 09/10/14 (from the past 48 hour(s))  Glucose, capillary     Status: Abnormal   Collection Time: 09/11/14 10:29 PM  Result Value Ref Range   Glucose-Capillary 105 (H) 70 - 99 mg/dL  Clostridium Difficile by PCR     Status: None   Collection Time: 09/12/14  2:40 AM  Result Value Ref Range   C difficile by pcr NEGATIVE NEGATIVE  Basic metabolic panel     Status: Abnormal   Collection Time: 09/12/14  5:35 AM  Result Value Ref Range   Sodium 140 137 - 147 mEq/L   Potassium 4.4 3.7 - 5.3 mEq/L    Comment: NO VISIBLE HEMOLYSIS   Chloride 106 96 - 112 mEq/L   CO2 21 19 - 32 mEq/L   Glucose, Bld 106 (H) 70 - 99 mg/dL   BUN 28 (H) 6 - 23 mg/dL   Creatinine, Ser 1.41 (H) 0.50 - 1.10 mg/dL   Calcium 8.7 8.4 - 10.5 mg/dL   GFR calc non Af Amer 35 (L) >90 mL/min   GFR calc Af Amer  41 (L) >90 mL/min    Comment: (NOTE) The eGFR has been calculated using the CKD EPI equation. This calculation has not been validated in all clinical situations. eGFR's persistently <90 mL/min signify possible Chronic Kidney Disease.    Anion gap 13 5 - 15  CBC     Status: Abnormal   Collection Time: 09/12/14  5:35 AM  Result Value Ref Range   WBC 15.1 (H) 4.0 - 10.5 K/uL   RBC 3.30 (L) 3.87 - 5.11 MIL/uL   Hemoglobin 9.9 (L) 12.0 - 15.0 g/dL   HCT 30.3 (L) 36.0 - 46.0 %   MCV 91.8 78.0 - 100.0 fL   MCH 30.0 26.0 - 34.0 pg   MCHC 32.7 30.0 - 36.0 g/dL   RDW 13.8 11.5 - 15.5 %   Platelets 138 (L) 150 - 400 K/uL  Glucose, capillary     Status: Abnormal   Collection Time: 09/12/14  7:52 AM  Result Value Ref Range   Glucose-Capillary 100 (H) 70 - 99 mg/dL  Culture, blood (routine x 2)     Status: None (Preliminary result)   Collection Time: 09/12/14 11:48  AM  Result Value Ref Range   Specimen Description BLOOD RIGHT ANTECUBITAL    Special Requests BOTTLES DRAWN AEROBIC AND ANAEROBIC 10CC    Culture  Setup Time      09/12/2014 17:07 Performed at [facility]    Culture             BLOOD CULTURE RECEIVED NO GROWTH TO DATE CULTURE WILL BE HELD FOR 5 DAYS BEFORE ISSUING A FINAL NEGATIVE REPORT Performed at [facility]    Report Status PENDING   Culture, blood (routine x 2)     Status: None (Preliminary result)   Collection Time: 09/12/14 11:55 AM  Result Value Ref Range   Specimen Description BLOOD LEFT HAND    Special Requests BOTTLES DRAWN AEROBIC ONLY 10CC    Culture  Setup Time      09/12/2014 17:07 Performed at [facility] NO GROWTH TO DATE CULTURE WILL BE HELD FOR 5 DAYS BEFORE ISSUING A FINAL NEGATIVE REPORT Performed at [facility]    Report Status PENDING   Glucose, capillary     Status: None   Collection Time: 09/12/14 12:35 PM  Result Value Ref Range   Glucose-Capillary 92 70 -  99 mg/dL  Glucose, capillary     Status: None   Collection Time: 09/12/14 12:51 PM  Result Value Ref Range   Glucose-Capillary 92 70 - 99 mg/dL  Glucose, capillary     Status: None   Collection Time: 09/12/14  4:38 PM  Result Value Ref Range   Glucose-Capillary 88 70 - 99 mg/dL  Glucose, capillary     Status: None   Collection Time: 09/12/14  9:29 PM  Result Value Ref Range   Glucose-Capillary 73 70 - 99 mg/dL   Comment 1 Documented in Chart    Comment 2 Notify RN   Glucose, capillary     Status: None   Collection Time: 09/12/14 11:02 PM  Result Value Ref Range   Glucose-Capillary 92 70 - 99 mg/dL   Comment 1 Documented in Chart    Comment 2 Notify RN   Glucose, capillary     Status: None   Collection Time: 09/13/14  7:52 AM  Result Value Ref Range   Glucose-Capillary 79 70 - 99 mg/dL  Glucose, capillary     Status: None   Collection Time: 09/13/14 11:56 AM  Result Value Ref Range   Glucose-Capillary 72 70 - 99 mg/dL  Glucose, capillary     Status: None   Collection Time: 09/13/14  4:59 PM  Result Value Ref Range   Glucose-Capillary 84 70 - 99 mg/dL    Dg Chest 2 View  09/12/2014   CLINICAL DATA:  Several day history of cough and generalized weakness. Current history of hypertension and diabetes. Current history of mixed cystic and solid left lower pole renal mass likely renal cell carcinoma.  EXAM: CHEST  2 VIEW  COMPARISON:  Two-view chest x-ray 02/15/2014. CT chest 11/12/2012. One view chest x-ray 10/23/2012.  FINDINGS: AP erect and lateral images were obtained. Suboptimal inspiration accounts for crowded bronchovascular markings, especially in the bases, and accentuates the cardiac silhouette. Vague focal opacities projected over the right mid lung and right base on the AP image, not clearly localized on the lateral, not present on the 01/2014 examination. Lungs otherwise clear. Bronchovascular markings normal. Pulmonary vascularity normal. No pleural effusions. No  pneumothorax. Cardiac silhouette upper normal in size to slightly enlarged for technique and degree of inspiration, unchanged. Thoracic aorta atherosclerotic, unchanged. Degenerative changes involving the thoracic spine.  IMPRESSION: 1. Suboptimal inspiration. Patchy pneumonia in the right lung is suspected. Followup chest x-ray after treatment is suggested to be sure the opacities resolved. 2. Stable borderline heart size. No acute cardiopulmonary disease otherwise.   Electronically Signed   By: Evangeline Dakin M.D.   On: 09/12/2014 11:41    ROS Blood pressure 155/44, pulse 71, temperature 98.9 F (37.2 C), temperature source Oral, resp. rate 20, height '5\' 2"'  (1.575 m), weight 57.879 kg (127 lb 9.6 oz), SpO2 99 %. Physical Exam  Constitutional: She appears well-developed.  HENT:  Left facial erythema. It is extending off the left mouth into the cheek about 2 cm area. The left upper lip is swollen with some pustules  on the mucosa. It is about 1.5 cm area. Possibly an abscess . There is no significant induration extending into the soft tissue of the cheek. No other lesions of the oc/op/ neck without swelling   Neck: Normal range of motion. Neck supple.    Assessment/Plan: Lip abscess- I discussed risk, benefits and options of I/D of the left upper lip. All questions answered and consent obtained. Prepped and draped.  The area was injected with 2 cc of 1% lido with epi. Incision made on the mucosal surface where the 3 pustules are located. The wound opened with hemastat. No signiifcant pus expressed. Some bleeding and iodoform wick placed in wound. She tolerated well. Continue IV abx and will remove the wick in 2 days but most likely it will fall out before.   Melissa Montane 09/13/2014, 5:11 PM

## 2014-09-13 NOTE — Progress Notes (Signed)
[facility] for Infectious Disease    Date of Admission:  09/10/2014   Total days of antibiotics 4        Day 4 vanco           ID: Tricia Dean is a 78 y.o. female with mrsa bacteremia associated with facial abscess/cellulitis  Principal Problem:   Facial cellulitis Active Problems:   Hypertension   Diabetes mellitus   Renal cell cancer   Cellulitis    Subjective: tmax of 100.2 last night. Has significant tenderness and guarding to upper left lip. Not eating due to pain. Underwent TTE yesterday, no vegetations  Medications:  . Chlorhexidine Gluconate Cloth  6 each Topical Q0600  . donepezil  10 mg Oral QHS  . enoxaparin (LOVENOX) injection  30 mg Subcutaneous Q24H  . insulin aspart  0-9 Units Subcutaneous TID WC  . insulin glargine  7 Units Subcutaneous Daily  . lidocaine-EPINEPHrine  20 mL Other Once  . mupirocin ointment  1 application Nasal BID  . pantoprazole  40 mg Oral Q1200  . traZODone  50 mg Oral QHS  . vancomycin  750 mg Intravenous Q24H    Objective: Vital signs in last 24 hours: Temp:  [98.9 F (37.2 C)-100.2 F (37.9 C)] 98.9 F (37.2 C) (11/25 1331) Pulse Rate:  [71-73] 71 (11/25 1331) Resp:  [20] 20 (11/25 1331) BP: (150-156)/(44-58) 155/44 mmHg (11/25 1331) SpO2:  [98 %-99 %] 99 % (11/25 1331)  Physical Exam  Constitutional:  oriented to person, place, frail elderly female. appears well-developed and well-nourished. No distress.  HENT: edematous left upper lip most apparent when she opens her mouth, has 1 large and 2 smaller "whitehead" abscess with surrounding erythema. Left lower face still slightly swollen but improved from admit Mouth/Throat: Oropharynx is clear and dry. No oropharyngeal exudate.  Cardiovascular: Normal rate, regular rhythm and normal heart sounds. Exam reveals no gallop and no friction rub.  No murmur heard.  Pulmonary/Chest: Effort normal and breath sounds normal. No respiratory distress.  has no wheezes.    Abdominal: Soft. Bowel sounds are normal.  exhibits no distension. There is no tenderness.  Lymphadenopathy: no cervical adenopathy.    Lab Results  Recent Labs  09/11/14 0618 09/12/14 0535  WBC 11.3* 15.1*  HGB 9.4* 9.9*  HCT 28.8* 30.3*  NA 143 140  K 3.2* 4.4  CL 106 106  CO2 23 21  BUN 28* 28*  CREATININE 1.51* 1.41*   Liver Panel  Recent Labs  09/11/14 0618  PROT 6.3  ALBUMIN 2.8*  AST 15  ALT 7  ALKPHOS 69  BILITOT 0.6   Sedimentation Rate No results for input(s): ESRSEDRATE in the last 72 hours. C-Reactive Protein No results for input(s): CRP in the last 72 hours.  Microbiology: 11/22 blood cx x 2 MRSA 11/24 blood cx NGTD Studies/Results: Dg Chest 2 View  09/12/2014   CLINICAL DATA:  Several day history of cough and generalized weakness. Current history of hypertension and diabetes. Current history of mixed cystic and solid left lower pole renal mass likely renal cell carcinoma.  EXAM: CHEST  2 VIEW  COMPARISON:  Two-view chest x-ray 02/15/2014. CT chest 11/12/2012. One view chest x-ray 10/23/2012.  FINDINGS: AP erect and lateral images were obtained. Suboptimal inspiration accounts for crowded bronchovascular markings, especially in the bases, and accentuates the cardiac silhouette. Vague focal opacities projected over the right mid lung and right base on the AP image, not clearly localized on the lateral, not  present on the 01/2014 examination. Lungs otherwise clear. Bronchovascular markings normal. Pulmonary vascularity normal. No pleural effusions. No pneumothorax. Cardiac silhouette upper normal in size to slightly enlarged for technique and degree of inspiration, unchanged. Thoracic aorta atherosclerotic, unchanged. Degenerative changes involving the thoracic spine.  IMPRESSION: 1. Suboptimal inspiration. Patchy pneumonia in the right lung is suspected. Followup chest x-ray after treatment is suggested to be sure the opacities resolved. 2. Stable borderline  heart size. No acute cardiopulmonary disease otherwise.   Electronically Signed   By: Evangeline Dakin M.D.   On: 09/12/2014 11:41     Assessment/Plan: MRSA skin soft tissue infection to face/lip c/b bacteremia = recommend to have surgery do simple I x D to the lip to help drain source and facilitate healing. If blood cx from 11/24 remain negative at 48-72%, can likely have picc line placed. Would treat for 2 wk with vancomycin then can transition to oral antibiotics if needed.  Oral pain = consider using lidocaine to help local tenderness and soft diet so that she can eat  Constipation = may need suppository tonite  Dr. Megan Salon available for questions if needed  [facility], [facility] for Infectious Diseases Cell: [phone] Pager: [phone]  09/13/2014, 3:08 PM

## 2014-09-13 NOTE — Progress Notes (Signed)
Patient Demographics  Tricia Dean, is a 78 y.o. female, DOB - May 27, 1936, YPP:[id number]  Admit date - 09/10/2014   Admitting Physician Domenic Polite, MD  Outpatient Primary MD for the patient is Donnie Coffin, MD  LOS - 3   Chief Complaint  Patient presents with  . Facial Swelling  . Fever        Subjective:   Tricia Dean today has, No headache, No chest pain, No abdominal pain - No Nausea, No new weakness tingling or numbness, No Cough - SOB.    Assessment & Plan    1. L. Facial cellulitis with MRSA bacteremia and sepsis - post incision and drainage done in the ER, still has significant swelling, stable CT maxillofacial with contrast, continue  Vancomycin & Cefazolin and ID input,  Monitor. Stable TTE. Repeat blood cultures on 09/12/2014. If negative for 48 hours PICC line for a total of 2 weeks of IV antibiotics. L lower Lip small pus ponts and small abscess, called ENT for input.   2. Mild dementia. A new supportive care. She is on Aricept, at risk for delirium. Minimize narcotics and benzos.   3. Low potassium replaced. Recheck in the morning.   4. GERD. Continue PPI.   5.DM2 - On ISS and low-dose Lantus for better control. Consult diabetic educator.  Lab Results  Component Value Date   HGBA1C 6.0* 09/11/2014    CBG (last 3)   Recent Labs  09/12/14 2129 09/12/14 2302 09/13/14 0752  GLUCAP 73 92 79         Code Status: Full  Family Communication: daughter  Disposition Plan: TBD   Procedures CT Max-Facial, TTE  TTE  - Left ventricle: The cavity size was normal. There was mild focal basal hypertrophy of the septum. Systolic function was normal. The estimated ejection fraction was in the range of 60% to 65%. Wall motion was normal; there were no regional  wall motion abnormalities. - Mitral valve: Calcified annulus. There was mild regurgitation. - Left atrium: The atrium was mildly dilated. - Pulmonary arteries: Systolic pressure was moderately increased. PA peak pressure: 49 mm Hg (S).    Consults  ID, ENT   Medications  Scheduled Meds: . Chlorhexidine Gluconate Cloth  6 each Topical Q0600  . donepezil  10 mg Oral QHS  . enoxaparin (LOVENOX) injection  30 mg Subcutaneous Q24H  . insulin aspart  0-9 Units Subcutaneous TID WC  . insulin glargine  7 Units Subcutaneous Daily  . mupirocin ointment  1 application Nasal BID  . pantoprazole  40 mg Oral Q1200  . traZODone  50 mg Oral QHS  . vancomycin  750 mg Intravenous Q24H   Continuous Infusions:   PRN Meds:.acetaminophen, ondansetron  DVT Prophylaxis  Lovenox    Lab Results  Component Value Date   PLT 138* 09/12/2014    Antibiotics    Anti-infectives    Start     Dose/Rate Route Frequency Ordered Stop   09/12/14 1600  vancomycin (VANCOCIN) IVPB 1000 mg/200 mL premix  Status:  Discontinued     1,000 mg200 mL/hr over 60 Minutes Intravenous Every 48 hours 09/10/14 1415 09/12/14 0827   09/12/14 1400  ceFAZolin (ANCEF) IVPB 1 g/50 mL premix  Status:  Discontinued  1 g100 mL/hr over 30 Minutes Intravenous 3 times per day 09/12/14 1043 09/12/14 1044   09/12/14 1130  ceFAZolin (ANCEF) IVPB 2 g/50 mL premix  Status:  Discontinued     2 g100 mL/hr over 30 Minutes Intravenous Every 12 hours 09/12/14 1044 09/12/14 1514   09/12/14 0900  vancomycin (VANCOCIN) IVPB 750 mg/150 ml premix     750 mg150 mL/hr over 60 Minutes Intravenous Every 24 hours 09/12/14 0827     09/11/14 1300  ampicillin-sulbactam (UNASYN) 1.5 g in sodium chloride 0.9 % 50 mL IVPB  Status:  Discontinued     1.5 g100 mL/hr over 30 Minutes Intravenous Every 12 hours 09/11/14 1144 09/12/14 1043   09/10/14 1445  vancomycin (VANCOCIN) IVPB 1000 mg/200 mL premix     1,000 mg200 mL/hr over 60 Minutes Intravenous  Once  09/10/14 1415 09/10/14 1633          Objective:   Filed Vitals:   09/12/14 0518 09/12/14 1334 09/12/14 2131 09/13/14 0700  BP: 141/51 138/41 150/50 156/58  Pulse: 78 61 73 72  Temp: 98.7 F (37.1 C) 98.6 F (37 C) 99.4 F (37.4 C) 100.2 F (37.9 C)  TempSrc: Oral  Oral Oral  Resp: 18 28 20 20   Height:      Weight:      SpO2: 97% 99% 98% 98%    Wt Readings from Last 3 Encounters:  09/10/14 57.879 kg (127 lb 9.6 oz)  10/23/12 55.566 kg (122 lb 8 oz)     Intake/Output Summary (Last 24 hours) at 09/13/14 1030 Last data filed at 09/13/14 6759  Gross per 24 hour  Intake    120 ml  Output      0 ml  Net    120 ml     Physical Exam  Awake Alert, Oriented X 3, No new F.N deficits, Normal affect [place].AT,PERRAL, L facial swelling, 2-3 small abscesses on the inner aspect of left lower lip Supple Neck,No JVD, No cervical lymphadenopathy appriciated.  Symmetrical Chest wall movement, Good air movement bilaterally, CTAB RRR,No Gallops,Rubs or new Murmurs, No Parasternal Heave +ve B.Sounds, Abd Soft, No tenderness, No organomegaly appriciated, No rebound - guarding or rigidity. No Cyanosis, Clubbing or edema, No new Rash or bruise     Data Review   Micro Results Recent Results (from the past 240 hour(s))  Blood culture (routine x 2)     Status: None   Collection Time: 09/10/14  2:54 PM  Result Value Ref Range Status   Specimen Description BLOOD RIGHT ARM  Final   Special Requests BOTTLES DRAWN AEROBIC AND ANAEROBIC 5CC  Final   Culture  Setup Time   Final    09/10/2014 21:35 Performed at [facility]    Culture   Final    METHICILLIN RESISTANT STAPHYLOCOCCUS AUREUS Note: RIFAMPIN AND GENTAMICIN SHOULD NOT BE USED AS SINGLE DRUGS FOR TREATMENT OF STAPH INFECTIONS. CRITICAL RESULT CALLED TO, READ BACK BY AND VERIFIED WITH: JESSE Alburtis 09/12/14 1300 BY SMITHERSJ This organism DOES NOT demonstrate inducible  Clindamycin resistance in vitro. Note: Gram Stain  Report Called to,Read Back By and Verified With: [facility] JACER 09/11/14 1035 BY SMITHERSJ Performed at [facility]    Report Status 09/13/2014 FINAL  Final   Organism ID, Bacteria METHICILLIN RESISTANT STAPHYLOCOCCUS AUREUS  Final      Susceptibility   Methicillin resistant staphylococcus aureus - MIC*    CLINDAMYCIN <=0.25 SENSITIVE Sensitive     ERYTHROMYCIN >=8 RESISTANT Resistant  GENTAMICIN <=0.5 SENSITIVE Sensitive     LEVOFLOXACIN 4 INTERMEDIATE Intermediate     OXACILLIN >=4 RESISTANT Resistant     PENICILLIN >=0.5 RESISTANT Resistant     RIFAMPIN <=0.5 SENSITIVE Sensitive     TRIMETH/SULFA <=10 SENSITIVE Sensitive     VANCOMYCIN <=0.5 SENSITIVE Sensitive     TETRACYCLINE <=1 SENSITIVE Sensitive     * METHICILLIN RESISTANT STAPHYLOCOCCUS AUREUS  Blood culture (routine x 2)     Status: None   Collection Time: 09/10/14  2:59 PM  Result Value Ref Range Status   Specimen Description BLOOD LEFT FOREARM  Final   Special Requests BOTTLES DRAWN AEROBIC AND ANAEROBIC 5CC  Final   Culture  Setup Time   Final    09/10/2014 21:35 Performed at [facility]    Culture   Final    STAPHYLOCOCCUS AUREUS Note: SUSCEPTIBILITIES PERFORMED ON PREVIOUS CULTURE WITHIN THE LAST 5 DAYS. Note: Gram Stain Report Called to,Read Back By and Verified With: Lavone Nian 09/11/14 BY SMITHERSJ Performed at [facility]    Report Status 09/13/2014 FINAL  Final  MRSA PCR Screening     Status: Abnormal   Collection Time: 09/10/14  7:15 PM  Result Value Ref Range Status   MRSA by PCR POSITIVE (A) NEGATIVE Final    Comment:        The GeneXpert MRSA Assay (FDA approved for NASAL specimens only), is one component of a comprehensive MRSA colonization surveillance program. It is not intended to diagnose MRSA infection nor to guide or monitor treatment for MRSA infections. RESULT CALLED TO, READ BACK BY AND VERIFIED WITH: WILEY,N RN [id number] 09/10/14 MITCHELL,L   Clostridium  Difficile by PCR     Status: None   Collection Time: 09/12/14  2:40 AM  Result Value Ref Range Status   C difficile by pcr NEGATIVE NEGATIVE Final  Culture, blood (routine x 2)     Status: None (Preliminary result)   Collection Time: 09/12/14 11:48 AM  Result Value Ref Range Status   Specimen Description BLOOD RIGHT ANTECUBITAL  Final   Special Requests BOTTLES DRAWN AEROBIC AND ANAEROBIC 10CC  Final   Culture  Setup Time   Final    09/12/2014 17:07 Performed at [facility]    Culture   Final           BLOOD CULTURE RECEIVED NO GROWTH TO DATE CULTURE WILL BE HELD FOR 5 DAYS BEFORE ISSUING A FINAL NEGATIVE REPORT Performed at [facility]    Report Status PENDING  Incomplete  Culture, blood (routine x 2)     Status: None (Preliminary result)   Collection Time: 09/12/14 11:55 AM  Result Value Ref Range Status   Specimen Description BLOOD LEFT HAND  Final   Special Requests BOTTLES DRAWN AEROBIC ONLY 10CC  Final   Culture  Setup Time   Final    09/12/2014 17:07 Performed at [facility]    Culture   Final           BLOOD CULTURE RECEIVED NO GROWTH TO DATE CULTURE WILL BE HELD FOR 5 DAYS BEFORE ISSUING A FINAL NEGATIVE REPORT Performed at [facility]    Report Status PENDING  Incomplete    Radiology Reports Dg Chest 2 View  09/12/2014   CLINICAL DATA:  Several day history of cough and generalized weakness. Current history of hypertension and diabetes. Current history of mixed cystic and solid left lower pole renal mass likely renal cell carcinoma.  EXAM:  CHEST  2 VIEW  COMPARISON:  Two-view chest x-ray 02/15/2014. CT chest 11/12/2012. One view chest x-ray 10/23/2012.  FINDINGS: AP erect and lateral images were obtained. Suboptimal inspiration accounts for crowded bronchovascular markings, especially in the bases, and accentuates the cardiac silhouette. Vague focal opacities projected over the right mid lung and right base on the AP image, not clearly  localized on the lateral, not present on the 01/2014 examination. Lungs otherwise clear. Bronchovascular markings normal. Pulmonary vascularity normal. No pleural effusions. No pneumothorax. Cardiac silhouette upper normal in size to slightly enlarged for technique and degree of inspiration, unchanged. Thoracic aorta atherosclerotic, unchanged. Degenerative changes involving the thoracic spine.  IMPRESSION: 1. Suboptimal inspiration. Patchy pneumonia in the right lung is suspected. Followup chest x-ray after treatment is suggested to be sure the opacities resolved. 2. Stable borderline heart size. No acute cardiopulmonary disease otherwise.   Electronically Signed   By: Evangeline Dakin M.D.   On: 09/12/2014 11:41   Ct Maxillofacial W/cm  09/11/2014   CLINICAL DATA:  Left facial swelling and redness  EXAM: CT MAXILLOFACIAL WITH CONTRAST  TECHNIQUE: Multidetector CT imaging of the maxillofacial structures was performed with intravenous contrast. Multiplanar CT image reconstructions were also generated. A small metallic BB was placed on the right temple in order to reliably differentiate right from left.  CONTRAST:  50mL OMNIPAQUE IOHEXOL 300 MG/ML  SOLN  COMPARISON:  None.  FINDINGS: Skull base is contents are within normal limits. The orbits and their contents are unremarkable.  In the left cheek and jaw region, there is skin thickening and subcutaneous edema identified. No focal subcutaneous abscess is noted. No definitive bony erosion to suggest underlying osteomyelitis is noted. The visualized cervical spine is unremarkable. Carotid calcifications are seen. A small air-fluid level is noted within the left maxillary antrum. No other bony abnormality is noted.  IMPRESSION: Changes consistent with facial cellulitis on the left in the region of the cheek and jaw. No areas to suggest subcutaneous abscess are noted. No bony erosion is seen   Electronically Signed   By: Inez Catalina M.D.   On: 09/11/2014 13:56       CBC  Recent Labs Lab 09/10/14 1258 09/11/14 0618 09/12/14 0535  WBC 11.6* 11.3* 15.1*  HGB 11.2* 9.4* 9.9*  HCT 34.1* 28.8* 30.3*  PLT 183 136* 138*  MCV 91.4 89.7 91.8  MCH 30.0 29.3 30.0  MCHC 32.8 32.6 32.7  RDW 13.1 13.5 13.8  LYMPHSABS 0.6*  --   --   MONOABS 0.5  --   --   EOSABS 0.0  --   --   BASOSABS 0.0  --   --     Chemistries   Recent Labs Lab 09/10/14 1258 09/11/14 0618 09/12/14 0535  NA 139 143 140  K 4.2 3.2* 4.4  CL 100 106 106  CO2 26 23 21   GLUCOSE 160* 110* 106*  BUN 26* 28* 28*  CREATININE 1.53* 1.51* 1.41*  CALCIUM 9.5 8.0* 8.7  AST 16 15  --   ALT 9 7  --   ALKPHOS 88 69  --   BILITOT 0.8 0.6  --    ------------------------------------------------------------------------------------------------------------------ estimated creatinine clearance is 26.8 mL/min (by C-G formula based on Cr of 1.41). ------------------------------------------------------------------------------------------------------------------  Recent Labs  09/10/14 1835 09/11/14 1130  HGBA1C 5.9* 6.0*   ------------------------------------------------------------------------------------------------------------------ No results for input(s): CHOL, HDL, LDLCALC, TRIG, CHOLHDL, LDLDIRECT in the last 72 hours. ------------------------------------------------------------------------------------------------------------------ No results for input(s): TSH, T4TOTAL, T3FREE, THYROIDAB in the last  72 hours.  Invalid input(s): FREET3 ------------------------------------------------------------------------------------------------------------------ No results for input(s): VITAMINB12, FOLATE, FERRITIN, TIBC, IRON, RETICCTPCT in the last 72 hours.  Coagulation profile No results for input(s): INR, PROTIME in the last 168 hours.  No results for input(s): DDIMER in the last 72 hours.  Cardiac Enzymes No results for input(s): CKMB, TROPONINI, MYOGLOBIN in the last 168  hours.  Invalid input(s): CK ------------------------------------------------------------------------------------------------------------------ Invalid input(s): POCBNP     Time Spent in minutes   35   Goldy Calandra K M.D on 09/13/2014 at 10:30 AM  Between 7am to 7pm - Pager - [phone]  After 7pm go to [email or web address] - password [email or web address]  And look for the night coverage person covering for me after hours  [facility] Office  [phone]

## 2014-09-14 LAB — GLUCOSE, CAPILLARY
Glucose-Capillary: 92 mg/dL (ref 70–99)
Glucose-Capillary: 96 mg/dL (ref 70–99)
Glucose-Capillary: 128 mg/dL — ABNORMAL HIGH (ref 70–99)
Glucose-Capillary: 113 mg/dL — ABNORMAL HIGH (ref 70–99)

## 2014-09-14 MED ORDER — POLYETHYLENE GLYCOL 3350 17 G PO PACK
17.0000 g | PACK | Freq: Two times a day (BID) | ORAL | Status: DC
  Administered 2014-09-14 – 2014-09-16 (×4): 17 g via ORAL
  Filled 2014-09-14 (×6): qty 1

## 2014-09-14 MED ORDER — BISACODYL 10 MG RE SUPP: 10.0000 mg | Freq: Every day | RECTAL | Status: DC

## 2014-09-14 NOTE — Progress Notes (Signed)
Patient Demographics  Tricia Dean, is a 78 y.o. female, DOB - 11-13-1935, ZOX:[id number]  Admit date - 09/10/2014   Admitting Physician Domenic Polite, MD  Outpatient Primary MD for the patient is Donnie Coffin, MD  LOS - 4   Chief Complaint  Patient presents with  . Facial Swelling  . Fever        Subjective:   Tricia Dean today has, No headache, No chest pain, No abdominal pain - No Nausea, No new weakness tingling or numbness, No Cough - SOB.    Assessment & Plan    1. L. Facial cellulitis with MRSA bacteremia and sepsis - post incision and drainage done in the ER on the day of admission and then ENT on 05/13/2014, also seen by infectious disease Dr. Graylon Good. Plan is PICC line. 2 more weeks of IV vancomycin for MRSA.   2. Mild dementia. A new supportive care. She is on Aricept, at risk for delirium. Minimize narcotics and benzos.    3. Low potassium replaced.      4. GERD. Continue PPI.    5.DM2 - On ISS and low-dose Lantus for better control. Consult diabetic educator.  Lab Results  Component Value Date   HGBA1C 6.0* 09/11/2014    CBG (last 3)   Recent Labs  09/13/14 1659 09/13/14 2158 09/14/14 0811  GLUCAP 84 97 92         Code Status: Full  Family Communication: daughter  Disposition Plan: TBD   Procedures   CT Max-Facial   TTE  - Left ventricle: The cavity size was normal. There was mild focal basal hypertrophy of the septum. Systolic function was normal. The estimated ejection fraction was in the range of 60% to 65%. Wall motion was normal; there were no regional wall motion abnormalities. - Mitral valve: Calcified annulus. There was mild regurgitation. - Left atrium: The atrium was mildly dilated. - Pulmonary arteries: Systolic pressure  was moderately increased. PA peak pressure: 49 mm Hg (S).    I&D L inner lip by Dr Simeon Craft ENT 09-13-14     Consults  ID, ENT   Medications  Scheduled Meds: . bisacodyl  10 mg Rectal Daily  . Chlorhexidine Gluconate Cloth  6 each Topical Q0600  . donepezil  10 mg Oral QHS  . enoxaparin (LOVENOX) injection  30 mg Subcutaneous Q24H  . insulin aspart  0-9 Units Subcutaneous TID WC  . insulin glargine  7 Units Subcutaneous Daily  . mupirocin ointment  1 application Nasal BID  . pantoprazole  40 mg Oral Q1200  . polyethylene glycol  17 g Oral BID  . traZODone  50 mg Oral QHS  . vancomycin  750 mg Intravenous Q24H   Continuous Infusions:   PRN Meds:.acetaminophen, morphine injection, ondansetron  DVT Prophylaxis  Lovenox    Lab Results  Component Value Date   PLT 138* 09/12/2014    Antibiotics    Anti-infectives    Start     Dose/Rate Route Frequency Ordered Stop   09/12/14 1600  vancomycin (VANCOCIN) IVPB 1000 mg/200 mL premix  Status:  Discontinued     1,000 mg200 mL/hr over 60 Minutes Intravenous Every 48 hours 09/10/14 1415 09/12/14 0827   09/12/14 1400  ceFAZolin (ANCEF) IVPB 1  g/50 mL premix  Status:  Discontinued     1 g100 mL/hr over 30 Minutes Intravenous 3 times per day 09/12/14 1043 09/12/14 1044   09/12/14 1130  ceFAZolin (ANCEF) IVPB 2 g/50 mL premix  Status:  Discontinued     2 g100 mL/hr over 30 Minutes Intravenous Every 12 hours 09/12/14 1044 09/12/14 1514   09/12/14 0900  vancomycin (VANCOCIN) IVPB 750 mg/150 ml premix     750 mg150 mL/hr over 60 Minutes Intravenous Every 24 hours 09/12/14 0827     09/11/14 1300  ampicillin-sulbactam (UNASYN) 1.5 g in sodium chloride 0.9 % 50 mL IVPB  Status:  Discontinued     1.5 g100 mL/hr over 30 Minutes Intravenous Every 12 hours 09/11/14 1144 09/12/14 1043   09/10/14 1445  vancomycin (VANCOCIN) IVPB 1000 mg/200 mL premix     1,000 mg200 mL/hr over 60 Minutes Intravenous  Once 09/10/14 1415 09/10/14 1633           Objective:   Filed Vitals:   09/13/14 1331 09/13/14 2155 09/14/14 0523 09/14/14 0526  BP: 155/44 91/71 142/121 147/54  Pulse: 71 80 69   Temp: 98.9 F (37.2 C) 100.5 F (38.1 C) 99.7 F (37.6 C)   TempSrc: Oral Oral Oral   Resp: 20     Height:      Weight:      SpO2: 99% 98% 97%     Wt Readings from Last 3 Encounters:  09/10/14 57.879 kg (127 lb 9.6 oz)  10/23/12 55.566 kg (122 lb 8 oz)     Intake/Output Summary (Last 24 hours) at 09/14/14 0959 Last data filed at 09/13/14 1630  Gross per 24 hour  Intake    660 ml  Output      0 ml  Net    660 ml     Physical Exam  Awake  Oriented X 1, No new F.N deficits, Normal affect [place].AT,PERRAL, improved L facial swelling  Supple Neck,No JVD, No cervical lymphadenopathy appriciated.  Symmetrical Chest wall movement, Good air movement bilaterally, CTAB RRR,No Gallops,Rubs or new Murmurs, No Parasternal Heave +ve B.Sounds, Abd Soft, No tenderness, No organomegaly appriciated, No rebound - guarding or rigidity. No Cyanosis, Clubbing or edema, No new Rash or bruise     Data Review   Micro Results Recent Results (from the past 240 hour(s))  Blood culture (routine x 2)     Status: None   Collection Time: 09/10/14  2:54 PM  Result Value Ref Range Status   Specimen Description BLOOD RIGHT ARM  Final   Special Requests BOTTLES DRAWN AEROBIC AND ANAEROBIC 5CC  Final   Culture  Setup Time   Final    09/10/2014 21:35 Performed at [facility]    Culture   Final    METHICILLIN RESISTANT STAPHYLOCOCCUS AUREUS Note: RIFAMPIN AND GENTAMICIN SHOULD NOT BE USED AS SINGLE DRUGS FOR TREATMENT OF STAPH INFECTIONS. CRITICAL RESULT CALLED TO, READ BACK BY AND VERIFIED WITH: JESSE [place] 09/12/14 1300 BY SMITHERSJ This organism DOES NOT demonstrate inducible  Clindamycin resistance in vitro. Note: Gram Stain Report Called to,Read Back By and Verified With: [facility] JACER 09/11/14 1035 BY SMITHERSJ Performed at [facility]    Report Status 09/13/2014 FINAL  Final   Organism ID, Bacteria METHICILLIN RESISTANT STAPHYLOCOCCUS AUREUS  Final      Susceptibility   Methicillin resistant staphylococcus aureus - MIC*    CLINDAMYCIN <=0.25 SENSITIVE Sensitive     ERYTHROMYCIN >=8 RESISTANT Resistant  GENTAMICIN <=0.5 SENSITIVE Sensitive     LEVOFLOXACIN 4 INTERMEDIATE Intermediate     OXACILLIN >=4 RESISTANT Resistant     PENICILLIN >=0.5 RESISTANT Resistant     RIFAMPIN <=0.5 SENSITIVE Sensitive     TRIMETH/SULFA <=10 SENSITIVE Sensitive     VANCOMYCIN <=0.5 SENSITIVE Sensitive     TETRACYCLINE <=1 SENSITIVE Sensitive     * METHICILLIN RESISTANT STAPHYLOCOCCUS AUREUS  Blood culture (routine x 2)     Status: None   Collection Time: 09/10/14  2:59 PM  Result Value Ref Range Status   Specimen Description BLOOD LEFT FOREARM  Final   Special Requests BOTTLES DRAWN AEROBIC AND ANAEROBIC 5CC  Final   Culture  Setup Time   Final    09/10/2014 21:35 Performed at [facility]    Culture   Final    STAPHYLOCOCCUS AUREUS Note: SUSCEPTIBILITIES PERFORMED ON PREVIOUS CULTURE WITHIN THE LAST 5 DAYS. Note: Gram Stain Report Called to,Read Back By and Verified With: Lavone Nian 09/11/14 BY SMITHERSJ Performed at [facility]    Report Status 09/13/2014 FINAL  Final  MRSA PCR Screening     Status: Abnormal   Collection Time: 09/10/14  7:15 PM  Result Value Ref Range Status   MRSA by PCR POSITIVE (A) NEGATIVE Final    Comment:        The GeneXpert MRSA Assay (FDA approved for NASAL specimens only), is one component of a comprehensive MRSA colonization surveillance program. It is not intended to diagnose MRSA infection nor to guide or monitor treatment for MRSA infections. RESULT CALLED TO, READ BACK BY AND VERIFIED WITH: WILEY,N RN 2213 09/10/14 MITCHELL,L   Clostridium Difficile by PCR     Status: None   Collection Time: 09/12/14  2:40 AM  Result Value Ref Range Status   C  difficile by pcr NEGATIVE NEGATIVE Final  Culture, blood (routine x 2)     Status: None (Preliminary result)   Collection Time: 09/12/14 11:48 AM  Result Value Ref Range Status   Specimen Description BLOOD RIGHT ANTECUBITAL  Final   Special Requests BOTTLES DRAWN AEROBIC AND ANAEROBIC 10CC  Final   Culture  Setup Time   Final    09/12/2014 17:07 Performed at [facility]    Culture   Final           BLOOD CULTURE RECEIVED NO GROWTH TO DATE CULTURE WILL BE HELD FOR 5 DAYS BEFORE ISSUING A FINAL NEGATIVE REPORT Performed at [facility]    Report Status PENDING  Incomplete  Culture, blood (routine x 2)     Status: None (Preliminary result)   Collection Time: 09/12/14 11:55 AM  Result Value Ref Range Status   Specimen Description BLOOD LEFT HAND  Final   Special Requests BOTTLES DRAWN AEROBIC ONLY 10CC  Final   Culture  Setup Time   Final    09/12/2014 17:07 Performed at [facility]    Culture   Final           BLOOD CULTURE RECEIVED NO GROWTH TO DATE CULTURE WILL BE HELD FOR 5 DAYS BEFORE ISSUING A FINAL NEGATIVE REPORT Performed at [facility]    Report Status PENDING  Incomplete    Radiology Reports Dg Chest 2 View  09/12/2014   CLINICAL DATA:  Several day history of cough and generalized weakness. Current history of hypertension and diabetes. Current history of mixed cystic and solid left lower pole renal mass likely renal cell carcinoma.  EXAM:  CHEST  2 VIEW  COMPARISON:  Two-view chest x-ray 02/15/2014. CT chest 11/12/2012. One view chest x-ray 10/23/2012.  FINDINGS: AP erect and lateral images were obtained. Suboptimal inspiration accounts for crowded bronchovascular markings, especially in the bases, and accentuates the cardiac silhouette. Vague focal opacities projected over the right mid lung and right base on the AP image, not clearly localized on the lateral, not present on the 01/2014 examination. Lungs otherwise clear. Bronchovascular  markings normal. Pulmonary vascularity normal. No pleural effusions. No pneumothorax. Cardiac silhouette upper normal in size to slightly enlarged for technique and degree of inspiration, unchanged. Thoracic aorta atherosclerotic, unchanged. Degenerative changes involving the thoracic spine.  IMPRESSION: 1. Suboptimal inspiration. Patchy pneumonia in the right lung is suspected. Followup chest x-ray after treatment is suggested to be sure the opacities resolved. 2. Stable borderline heart size. No acute cardiopulmonary disease otherwise.   Electronically Signed   By: Evangeline Dakin M.D.   On: 09/12/2014 11:41   Ct Maxillofacial W/cm  09/11/2014   CLINICAL DATA:  Left facial swelling and redness  EXAM: CT MAXILLOFACIAL WITH CONTRAST  TECHNIQUE: Multidetector CT imaging of the maxillofacial structures was performed with intravenous contrast. Multiplanar CT image reconstructions were also generated. A small metallic BB was placed on the right temple in order to reliably differentiate right from left.  CONTRAST:  73mL OMNIPAQUE IOHEXOL 300 MG/ML  SOLN  COMPARISON:  None.  FINDINGS: Skull base is contents are within normal limits. The orbits and their contents are unremarkable.  In the left cheek and jaw region, there is skin thickening and subcutaneous edema identified. No focal subcutaneous abscess is noted. No definitive bony erosion to suggest underlying osteomyelitis is noted. The visualized cervical spine is unremarkable. Carotid calcifications are seen. A small air-fluid level is noted within the left maxillary antrum. No other bony abnormality is noted.  IMPRESSION: Changes consistent with facial cellulitis on the left in the region of the cheek and jaw. No areas to suggest subcutaneous abscess are noted. No bony erosion is seen   Electronically Signed   By: Inez Catalina M.D.   On: 09/11/2014 13:56     CBC  Recent Labs Lab 09/10/14 1258 09/11/14 0618 09/12/14 0535  WBC 11.6* 11.3* 15.1*  HGB  11.2* 9.4* 9.9*  HCT 34.1* 28.8* 30.3*  PLT 183 136* 138*  MCV 91.4 89.7 91.8  MCH 30.0 29.3 30.0  MCHC 32.8 32.6 32.7  RDW 13.1 13.5 13.8  LYMPHSABS 0.6*  --   --   MONOABS 0.5  --   --   EOSABS 0.0  --   --   BASOSABS 0.0  --   --     Chemistries   Recent Labs Lab 09/10/14 1258 09/11/14 0618 09/12/14 0535  NA 139 143 140  K 4.2 3.2* 4.4  CL 100 106 106  CO2 26 23 21   GLUCOSE 160* 110* 106*  BUN 26* 28* 28*  CREATININE 1.53* 1.51* 1.41*  CALCIUM 9.5 8.0* 8.7  AST 16 15  --   ALT 9 7  --   ALKPHOS 88 69  --   BILITOT 0.8 0.6  --    ------------------------------------------------------------------------------------------------------------------ estimated creatinine clearance is 26.8 mL/min (by C-G formula based on Cr of 1.41). ------------------------------------------------------------------------------------------------------------------  Recent Labs  09/11/14 1130  HGBA1C 6.0*   ------------------------------------------------------------------------------------------------------------------ No results for input(s): CHOL, HDL, LDLCALC, TRIG, CHOLHDL, LDLDIRECT in the last 72 hours. ------------------------------------------------------------------------------------------------------------------ No results for input(s): TSH, T4TOTAL, T3FREE, THYROIDAB in the last 72 hours.  Invalid  input(s): FREET3 ------------------------------------------------------------------------------------------------------------------ No results for input(s): VITAMINB12, FOLATE, FERRITIN, TIBC, IRON, RETICCTPCT in the last 72 hours.  Coagulation profile No results for input(s): INR, PROTIME in the last 168 hours.  No results for input(s): DDIMER in the last 72 hours.  Cardiac Enzymes No results for input(s): CKMB, TROPONINI, MYOGLOBIN in the last 168 hours.  Invalid input(s):  CK ------------------------------------------------------------------------------------------------------------------ Invalid input(s): POCBNP     Time Spent in minutes   35   Favian Kittleson K M.D on 09/14/2014 at 9:59 AM  Between 7am to 7pm - Pager - [phone]  After 7pm go to [email or web address] - password [email or web address]  And look for the night coverage person covering for me after hours  [facility] Office  [phone]

## 2014-09-15 LAB — VANCOMYCIN, TROUGH: Vancomycin Tr: 12.7 ug/mL (ref 10.0–20.0)

## 2014-09-15 LAB — GLUCOSE, CAPILLARY
GLUCOSE-CAPILLARY: 120 mg/dL — AB (ref 70–99)
GLUCOSE-CAPILLARY: 96 mg/dL (ref 70–99)
Glucose-Capillary: 105 mg/dL — ABNORMAL HIGH (ref 70–99)
Glucose-Capillary: 142 mg/dL — ABNORMAL HIGH (ref 70–99)

## 2014-09-15 MED ORDER — SODIUM CHLORIDE 0.9 % IJ SOLN
10.0000 mL | INTRAMUSCULAR | Status: DC | PRN
  Administered 2014-09-15 – 2014-09-16 (×2): 10 mL
  Filled 2014-09-15 (×2): qty 40

## 2014-09-15 NOTE — Progress Notes (Signed)
Peripherally Inserted Central Catheter/Midline Placement  The IV Nurse has discussed with the patient and/or persons authorized to consent for the patient, the purpose of this procedure and the potential benefits and risks involved with this procedure.  The benefits include less needle sticks, lab draws from the catheter and patient may be discharged home with the catheter.  Risks include, but not limited to, infection, bleeding, blood clot (thrombus formation), and puncture of an artery; nerve damage and irregular heat beat.  Alternatives to this procedure were also discussed.  PICC/Midline Placement Documentation  PICC / Midline Single Lumen 08/05/50 PICC Right Basilic 39 cm 0 cm (Active)  Indication for Insertion or Continuance of Line Home intravenous therapies (PICC only) 09/15/2014  9:00 AM  Exposed Catheter (cm) 0 cm 09/15/2014  9:00 AM  Dressing Change Due 09/22/14 09/15/2014  9:00 AM       Tricia Dean 09/15/2014, 9:31 AM

## 2014-09-15 NOTE — Progress Notes (Signed)
ANTIBIOTIC CONSULT NOTE-Followup  Pharmacy Consult for vancomycin Indication: cellulitis   Allergies  Allergen Reactions  . Other     "miacin"?  . Penicillins     Tolerates Unasyn     Patient Measurements: Height: 5\' 2"  (157.5 cm) Weight: 127 lb 9.6 oz (57.879 kg) IBW/kg (Calculated) : 50.1 Adjusted Body Weight:   Vital Signs: Temp: 98.5 F (36.9 C) (11/27 0542) Temp Source: Oral (11/27 0542) BP: 151/55 mmHg (11/27 0542) Pulse Rate: 73 (11/27 0542) Intake/Output from previous day: 11/26 0701 - 11/27 0700 In: 150 [IV Piggyback:150] Out: -  Intake/Output from this shift:    Labs: No results for input(s): WBC, HGB, PLT, LABCREA, CREATININE in the last 72 hours. Medical History: Past Medical History  Diagnosis Date  . Hypertension   . Diabetes mellitus without complication   . Cancer     Renal    Medications:  Prescriptions prior to admission  Medication Sig Dispense Refill Last Dose  . donepezil (ARICEPT) 10 MG tablet Take 10 mg by mouth at bedtime.   09/09/2014 at Unknown time  . omeprazole (PRILOSEC) 40 MG capsule Take 1 capsule (40 mg total) by mouth daily. 30 capsule 1 09/10/2014 at Unknown time  . traZODone (DESYREL) 50 MG tablet Take 50 mg by mouth at bedtime.   09/09/2014 at Unknown time  . feeding supplement (ENSURE COMPLETE) LIQD Take 237 mLs by mouth 2 (two) times daily between meals. (Patient not taking: Reported on 09/10/2014) 30 Bottle 1   . promethazine (PHENERGAN) 25 MG tablet Take 1 tablet (25 mg total) by mouth every 6 (six) hours as needed. For nausea (Patient not taking: Reported on 09/10/2014) 30 tablet 0   . sucralfate (CARAFATE) 1 GM/10ML suspension Take 10 mLs (1 g total) by mouth 4 (four) times daily -  with meals and at bedtime. (Patient not taking: Reported on 09/10/2014) 420 mL 0    Assessment: Pt with MRSA facial cellulitis sensitive to vancomycin per culture reports. Post incision and drainage done in ER. Picc line placed, 2 more weeks  of IV vancomycin with stop date planned 09/28/14.   Tmax 100.4, most recent WBC 15.1 on 11/24. Vanc trough back on 11/27 therapeutic at 12.7.  Goal of Therapy:  Vancomycin trough level 10-15 mcg/ml  Plan:  - Continue vancomycin 750mg  q24h - Monitor renal fx and troughs as necessary  Megan E. Supple, Pharm.D Clinical Pharmacy Resident Pager: [phone] 09/15/2014 10:57 AM

## 2014-09-15 NOTE — Progress Notes (Signed)
Patient ID: Tricia Dean, female   DOB: [birth year], 78 y.o.   MRN: [id number]         [facility] for Infectious Disease    Date of Admission:  09/10/2014           Day 6 vancomycin  Principal Problem:   Facial cellulitis Active Problems:   Hypertension   Diabetes mellitus   Renal cell cancer   Cellulitis   Facial abscess   MRSA bacteremia   . bisacodyl  10 mg Rectal Daily  . Chlorhexidine Gluconate Cloth  6 each Topical Q0600  . donepezil  10 mg Oral QHS  . enoxaparin (LOVENOX) injection  30 mg Subcutaneous Q24H  . insulin aspart  0-9 Units Subcutaneous TID WC  . insulin glargine  7 Units Subcutaneous Daily  . mupirocin ointment  1 application Nasal BID  . pantoprazole  40 mg Oral Q1200  . polyethylene glycol  17 g Oral BID  . traZODone  50 mg Oral QHS  . vancomycin  750 mg Intravenous Q24H    OBJECTIVE: Blood pressure 141/57, pulse 82, temperature 100.4 F (38 C), temperature source Oral, resp. rate 18, height 5\' 2"  (1.575 m), weight 127 lb 9.6 oz (57.879 kg), SpO2 100 %. General: she is currently getting her PICC replaced so I could not examine her. Her nurse reports that she seems to be feeling better.   Lab Results Lab Results  Component Value Date   WBC 15.1* 09/12/2014   HGB 9.9* 09/12/2014   HCT 30.3* 09/12/2014   MCV 91.8 09/12/2014   PLT 138* 09/12/2014    Lab Results  Component Value Date   CREATININE 1.41* 09/12/2014   BUN 28* 09/12/2014   NA 140 09/12/2014   K 4.4 09/12/2014   CL 106 09/12/2014   CO2 21 09/12/2014    Lab Results  Component Value Date   ALT 7 09/11/2014   AST 15 09/11/2014   ALKPHOS 69 09/11/2014   BILITOT 0.6 09/11/2014     Microbiology: Recent Results (from the past 240 hour(s))  Blood culture (routine x 2)     Status: None   Collection Time: 09/10/14  2:54 PM  Result Value Ref Range Status   Specimen Description BLOOD RIGHT ARM  Final   Special Requests BOTTLES DRAWN AEROBIC AND ANAEROBIC 5CC  Final   Culture  Setup Time   Final    09/10/2014 21:35 Performed at [facility]    Culture   Final    METHICILLIN RESISTANT STAPHYLOCOCCUS AUREUS Note: RIFAMPIN AND GENTAMICIN SHOULD NOT BE USED AS SINGLE DRUGS FOR TREATMENT OF STAPH INFECTIONS. CRITICAL RESULT CALLED TO, READ BACK BY AND VERIFIED WITH: JESSE [place] 09/12/14 1300 BY SMITHERSJ This organism DOES NOT demonstrate inducible  Clindamycin resistance in vitro. Note: Gram Stain Report Called to,Read Back By and Verified With: [facility] JACER 09/11/14 1035 BY SMITHERSJ Performed at [facility]    Report Status 09/13/2014 FINAL  Final   Organism ID, Bacteria METHICILLIN RESISTANT STAPHYLOCOCCUS AUREUS  Final      Susceptibility   Methicillin resistant staphylococcus aureus - MIC*    CLINDAMYCIN <=0.25 SENSITIVE Sensitive     ERYTHROMYCIN >=8 RESISTANT Resistant     GENTAMICIN <=0.5 SENSITIVE Sensitive     LEVOFLOXACIN 4 INTERMEDIATE Intermediate     OXACILLIN >=4 RESISTANT Resistant     PENICILLIN >=0.5 RESISTANT Resistant     RIFAMPIN <=0.5 SENSITIVE Sensitive     TRIMETH/SULFA <=10 SENSITIVE Sensitive  VANCOMYCIN <=0.5 SENSITIVE Sensitive     TETRACYCLINE <=1 SENSITIVE Sensitive     * METHICILLIN RESISTANT STAPHYLOCOCCUS AUREUS  Blood culture (routine x 2)     Status: None   Collection Time: 09/10/14  2:59 PM  Result Value Ref Range Status   Specimen Description BLOOD LEFT FOREARM  Final   Special Requests BOTTLES DRAWN AEROBIC AND ANAEROBIC 5CC  Final   Culture  Setup Time   Final    09/10/2014 21:35 Performed at [facility]    Culture   Final    STAPHYLOCOCCUS AUREUS Note: SUSCEPTIBILITIES PERFORMED ON PREVIOUS CULTURE WITHIN THE LAST 5 DAYS. Note: Gram Stain Report Called to,Read Back By and Verified With: Lavone Nian 09/11/14 BY SMITHERSJ Performed at [facility]    Report Status 09/13/2014 FINAL  Final  MRSA PCR Screening     Status: Abnormal   Collection Time: 09/10/14  7:15  PM  Result Value Ref Range Status   MRSA by PCR POSITIVE (A) NEGATIVE Final    Comment:        The GeneXpert MRSA Assay (FDA approved for NASAL specimens only), is one component of a comprehensive MRSA colonization surveillance program. It is not intended to diagnose MRSA infection nor to guide or monitor treatment for MRSA infections. RESULT CALLED TO, READ BACK BY AND VERIFIED WITH: WILEY,N RN 2213 09/10/14 MITCHELL,L   Clostridium Difficile by PCR     Status: None   Collection Time: 09/12/14  2:40 AM  Result Value Ref Range Status   C difficile by pcr NEGATIVE NEGATIVE Final  Culture, blood (routine x 2)     Status: None (Preliminary result)   Collection Time: 09/12/14 11:48 AM  Result Value Ref Range Status   Specimen Description BLOOD RIGHT ANTECUBITAL  Final   Special Requests BOTTLES DRAWN AEROBIC AND ANAEROBIC 10CC  Final   Culture  Setup Time   Final    09/12/2014 17:07 Performed at [facility]    Culture   Final           BLOOD CULTURE RECEIVED NO GROWTH TO DATE CULTURE WILL BE HELD FOR 5 DAYS BEFORE ISSUING A FINAL NEGATIVE REPORT Performed at [facility]    Report Status PENDING  Incomplete  Culture, blood (routine x 2)     Status: None (Preliminary result)   Collection Time: 09/12/14 11:55 AM  Result Value Ref Range Status   Specimen Description BLOOD LEFT HAND  Final   Special Requests BOTTLES DRAWN AEROBIC ONLY 10CC  Final   Culture  Setup Time   Final    09/12/2014 17:07 Performed at [facility]    Culture   Final           BLOOD CULTURE RECEIVED NO GROWTH TO DATE CULTURE WILL BE HELD FOR 5 DAYS BEFORE ISSUING A FINAL NEGATIVE REPORT Performed at [facility]    Report Status PENDING  Incomplete    Assessment: She is improving on therapy for MRSA bacteremia and facial cellulitis.  Plan: 1. I agree with continuing vancomycin through December 12 2. I will arrange followup in our clinic before she completes  vancomycin therapy 3. Please call me for any questions this weekend  Michel Bickers, [place] [facility] [place] [phone] pager   [phone] cell 09/15/2014, 5:00 PM

## 2014-09-15 NOTE — Progress Notes (Signed)
Peripherally Inserted Central Catheter/Midline Placement  The IV Nurse has discussed with the patient and/or persons authorized to consent for the patient, the purpose of this procedure and the potential benefits and risks involved with this procedure.  The benefits include less needle sticks, lab draws from the catheter and patient may be discharged home with the catheter.  Risks include, but not limited to, infection, bleeding, blood clot (thrombus formation), and puncture of an artery; nerve damage and irregular heat beat.  Alternatives to this procedure were also discussed.  PICC/Midline Placement Documentation  PICC / Midline Single Lumen 96/04/54 PICC Right Basilic 39 cm 0 cm (Active)  Indication for Insertion or Continuance of Line Home intravenous therapies (PICC only) 09/15/2014  4:44 PM  Exposed Catheter (cm) 0 cm 09/15/2014  4:44 PM  Site Assessment Clean;Dry;Intact 09/15/2014  4:44 PM  Line Status Flushed;Saline locked;Blood return noted 09/15/2014  4:44 PM  Dressing Type Transparent 09/15/2014  4:44 PM  Dressing Change Due 09/22/14 09/15/2014  4:44 PM       Amenda Duclos, Maricela Bo 09/15/2014, 4:45 PM

## 2014-09-15 NOTE — Plan of Care (Signed)
Problem: Phase II Progression Outcomes Goal: Temperature < 101 Outcome: Completed/Met Date Met:  09/15/14

## 2014-09-15 NOTE — Progress Notes (Signed)
Patient Demographics  Tricia Dean, is a 78 y.o. female, DOB - 1936-09-21, XAJ:[id number]  Admit date - 09/10/2014   Admitting Physician Domenic Polite, MD  Outpatient Primary MD for the patient is Donnie Coffin, MD  LOS - 5   Chief Complaint  Patient presents with  . Facial Swelling  . Fever        Subjective:   [name] today has, No headache, No chest pain, No abdominal pain - No Nausea, No new weakness tingling or numbness, No Cough - SOB.    Assessment & Plan    1. L. Facial cellulitis with MRSA bacteremia and sepsis - post incision and drainage done in the ER on the day of admission and then ENT on 05/13/2014, also seen by infectious disease Dr. Graylon Good. Place PICC line, 2 more weeks of IV vancomycin for MRSA stop date 09-28-14.   2. Mild dementia. A new supportive care. She is on Aricept, at risk for delirium. Minimize narcotics and benzos.    3. Low potassium replaced.      4. GERD. Continue PPI.    5.DM2 - On ISS and low-dose Lantus for better control. Consult diabetic educator.  Lab Results  Component Value Date   HGBA1C 6.0* 09/11/2014    CBG (last 3)   Recent Labs  09/14/14 1646 09/14/14 2143 09/15/14 0737  GLUCAP 128* 113* 120*         Code Status: Full  Family Communication: daughter bedside  Disposition Plan: TBD   Procedures   CT Max-Facial   TTE  - Left ventricle: The cavity size was normal. There was mild focal basal hypertrophy of the septum. Systolic function was normal. The estimated ejection fraction was in the range of 60% to 65%. Wall motion was normal; there were no regional wall motion abnormalities. - Mitral valve: Calcified annulus. There was mild regurgitation. - Left atrium: The atrium was mildly dilated. -  Pulmonary arteries: Systolic pressure was moderately increased. PA peak pressure: 49 mm Hg (S).    I&D L Lower lip by Dr Simeon Craft ENT 09-13-14     Consults  ID, ENT   Medications  Scheduled Meds: . bisacodyl  10 mg Rectal Daily  . Chlorhexidine Gluconate Cloth  6 each Topical Q0600  . donepezil  10 mg Oral QHS  . enoxaparin (LOVENOX) injection  30 mg Subcutaneous Q24H  . insulin aspart  0-9 Units Subcutaneous TID WC  . insulin glargine  7 Units Subcutaneous Daily  . mupirocin ointment  1 application Nasal BID  . pantoprazole  40 mg Oral Q1200  . polyethylene glycol  17 g Oral BID  . traZODone  50 mg Oral QHS  . vancomycin  750 mg Intravenous Q24H   Continuous Infusions:   PRN Meds:.acetaminophen, morphine injection, ondansetron, sodium chloride  DVT Prophylaxis  Lovenox    Lab Results  Component Value Date   PLT 138* 09/12/2014    Antibiotics    Anti-infectives    Start     Dose/Rate Route Frequency Ordered Stop   09/12/14 1600  vancomycin (VANCOCIN) IVPB 1000 mg/200 mL premix  Status:  Discontinued     1,000 mg200 mL/hr over 60 Minutes Intravenous Every 48 hours 09/10/14 1415 09/12/14 0827   09/12/14 1400  ceFAZolin (ANCEF) IVPB 1 g/50 mL premix  Status:  Discontinued     1 g100 mL/hr over 30 Minutes Intravenous 3 times per day 09/12/14 1043 09/12/14 1044   09/12/14 1130  ceFAZolin (ANCEF) IVPB 2 g/50 mL premix  Status:  Discontinued     2 g100 mL/hr over 30 Minutes Intravenous Every 12 hours 09/12/14 1044 09/12/14 1514   09/12/14 0900  vancomycin (VANCOCIN) IVPB 750 mg/150 ml premix     750 mg150 mL/hr over 60 Minutes Intravenous Every 24 hours 09/12/14 0827     09/11/14 1300  ampicillin-sulbactam (UNASYN) 1.5 g in sodium chloride 0.9 % 50 mL IVPB  Status:  Discontinued     1.5 g100 mL/hr over 30 Minutes Intravenous Every 12 hours 09/11/14 1144 09/12/14 1043   09/10/14 1445  vancomycin (VANCOCIN) IVPB 1000 mg/200 mL premix     1,000 mg200 mL/hr over 60 Minutes  Intravenous  Once 09/10/14 1415 09/10/14 1633          Objective:   Filed Vitals:   09/14/14 0526 09/14/14 1500 09/14/14 2114 09/15/14 0542  BP: 147/54 147/52 155/55 151/55  Pulse:  78 73 73  Temp:  99 F (37.2 C) 100.4 F (38 C) 98.5 F (36.9 C)  TempSrc:  Oral Oral Oral  Resp:   15 12  Height:      Weight:      SpO2:  98% 97% 97%    Wt Readings from Last 3 Encounters:  09/10/14 57.879 kg (127 lb 9.6 oz)  10/23/12 55.566 kg (122 lb 8 oz)     Intake/Output Summary (Last 24 hours) at 09/15/14 0954 Last data filed at 09/15/14 0648  Gross per 24 hour  Intake    150 ml  Output      0 ml  Net    150 ml     Physical Exam  Awake  Oriented X 1, No new F.N deficits, Normal affect [place].AT,PERRAL, improved L facial swelling  Supple Neck,No JVD, No cervical lymphadenopathy appriciated.  Symmetrical Chest wall movement, Good air movement bilaterally, CTAB RRR,No Gallops,Rubs or new Murmurs, No Parasternal Heave +ve B.Sounds, Abd Soft, No tenderness, No organomegaly appriciated, No rebound - guarding or rigidity. No Cyanosis, Clubbing or edema, No new Rash or bruise, R arm Picc     Data Review   Micro Results Recent Results (from the past 240 hour(s))  Blood culture (routine x 2)     Status: None   Collection Time: 09/10/14  2:54 PM  Result Value Ref Range Status   Specimen Description BLOOD RIGHT ARM  Final   Special Requests BOTTLES DRAWN AEROBIC AND ANAEROBIC 5CC  Final   Culture  Setup Time   Final    09/10/2014 21:35 Performed at [facility]    Culture   Final    METHICILLIN RESISTANT STAPHYLOCOCCUS AUREUS Note: RIFAMPIN AND GENTAMICIN SHOULD NOT BE USED AS SINGLE DRUGS FOR TREATMENT OF STAPH INFECTIONS. CRITICAL RESULT CALLED TO, READ BACK BY AND VERIFIED WITH: JESSE Sanders 09/12/14 1300 BY SMITHERSJ This organism DOES NOT demonstrate inducible  Clindamycin resistance in vitro. Note: Gram Stain Report Called to,Read Back By and Verified With:  [facility] JACER 09/11/14 1035 BY SMITHERSJ Performed at [facility]    Report Status 09/13/2014 FINAL  Final   Organism ID, Bacteria METHICILLIN RESISTANT STAPHYLOCOCCUS AUREUS  Final      Susceptibility   Methicillin resistant staphylococcus aureus - MIC*    CLINDAMYCIN <=0.25 SENSITIVE Sensitive  ERYTHROMYCIN >=8 RESISTANT Resistant     GENTAMICIN <=0.5 SENSITIVE Sensitive     LEVOFLOXACIN 4 INTERMEDIATE Intermediate     OXACILLIN >=4 RESISTANT Resistant     PENICILLIN >=0.5 RESISTANT Resistant     RIFAMPIN <=0.5 SENSITIVE Sensitive     TRIMETH/SULFA <=10 SENSITIVE Sensitive     VANCOMYCIN <=0.5 SENSITIVE Sensitive     TETRACYCLINE <=1 SENSITIVE Sensitive     * METHICILLIN RESISTANT STAPHYLOCOCCUS AUREUS  Blood culture (routine x 2)     Status: None   Collection Time: 09/10/14  2:59 PM  Result Value Ref Range Status   Specimen Description BLOOD LEFT FOREARM  Final   Special Requests BOTTLES DRAWN AEROBIC AND ANAEROBIC 5CC  Final   Culture  Setup Time   Final    09/10/2014 21:35 Performed at [facility]    Culture   Final    STAPHYLOCOCCUS AUREUS Note: SUSCEPTIBILITIES PERFORMED ON PREVIOUS CULTURE WITHIN THE LAST 5 DAYS. Note: Gram Stain Report Called to,Read Back By and Verified With: Lavone Nian 09/11/14 BY SMITHERSJ Performed at [facility]    Report Status 09/13/2014 FINAL  Final  MRSA PCR Screening     Status: Abnormal   Collection Time: 09/10/14  7:15 PM  Result Value Ref Range Status   MRSA by PCR POSITIVE (A) NEGATIVE Final    Comment:        The GeneXpert MRSA Assay (FDA approved for NASAL specimens only), is one component of a comprehensive MRSA colonization surveillance program. It is not intended to diagnose MRSA infection nor to guide or monitor treatment for MRSA infections. RESULT CALLED TO, READ BACK BY AND VERIFIED WITH: WILEY,N RN [id number] 09/10/14 MITCHELL,L   Clostridium Difficile by PCR     Status: None   Collection  Time: 09/12/14  2:40 AM  Result Value Ref Range Status   C difficile by pcr NEGATIVE NEGATIVE Final  Culture, blood (routine x 2)     Status: None (Preliminary result)   Collection Time: 09/12/14 11:48 AM  Result Value Ref Range Status   Specimen Description BLOOD RIGHT ANTECUBITAL  Final   Special Requests BOTTLES DRAWN AEROBIC AND ANAEROBIC 10CC  Final   Culture  Setup Time   Final    09/12/2014 17:07 Performed at [facility]    Culture   Final           BLOOD CULTURE RECEIVED NO GROWTH TO DATE CULTURE WILL BE HELD FOR 5 DAYS BEFORE ISSUING A FINAL NEGATIVE REPORT Performed at [facility]    Report Status PENDING  Incomplete  Culture, blood (routine x 2)     Status: None (Preliminary result)   Collection Time: 09/12/14 11:55 AM  Result Value Ref Range Status   Specimen Description BLOOD LEFT HAND  Final   Special Requests BOTTLES DRAWN AEROBIC ONLY 10CC  Final   Culture  Setup Time   Final    09/12/2014 17:07 Performed at [facility]    Culture   Final           BLOOD CULTURE RECEIVED NO GROWTH TO DATE CULTURE WILL BE HELD FOR 5 DAYS BEFORE ISSUING A FINAL NEGATIVE REPORT Performed at [facility]    Report Status PENDING  Incomplete    Radiology Reports Dg Chest 2 View  09/12/2014   CLINICAL DATA:  Several day history of cough and generalized weakness. Current history of hypertension and diabetes. Current history of mixed cystic and solid left lower pole  renal mass likely renal cell carcinoma.  EXAM: CHEST  2 VIEW  COMPARISON:  Two-view chest x-ray 02/15/2014. CT chest 11/12/2012. One view chest x-ray 10/23/2012.  FINDINGS: AP erect and lateral images were obtained. Suboptimal inspiration accounts for crowded bronchovascular markings, especially in the bases, and accentuates the cardiac silhouette. Vague focal opacities projected over the right mid lung and right base on the AP image, not clearly localized on the lateral, not present on the  01/2014 examination. Lungs otherwise clear. Bronchovascular markings normal. Pulmonary vascularity normal. No pleural effusions. No pneumothorax. Cardiac silhouette upper normal in size to slightly enlarged for technique and degree of inspiration, unchanged. Thoracic aorta atherosclerotic, unchanged. Degenerative changes involving the thoracic spine.  IMPRESSION: 1. Suboptimal inspiration. Patchy pneumonia in the right lung is suspected. Followup chest x-ray after treatment is suggested to be sure the opacities resolved. 2. Stable borderline heart size. No acute cardiopulmonary disease otherwise.   Electronically Signed   By: Evangeline Dakin M.D.   On: 09/12/2014 11:41   Ct Maxillofacial W/cm  09/11/2014   CLINICAL DATA:  Left facial swelling and redness  EXAM: CT MAXILLOFACIAL WITH CONTRAST  TECHNIQUE: Multidetector CT imaging of the maxillofacial structures was performed with intravenous contrast. Multiplanar CT image reconstructions were also generated. A small metallic BB was placed on the right temple in order to reliably differentiate right from left.  CONTRAST:  15mL OMNIPAQUE IOHEXOL 300 MG/ML  SOLN  COMPARISON:  None.  FINDINGS: Skull base is contents are within normal limits. The orbits and their contents are unremarkable.  In the left cheek and jaw region, there is skin thickening and subcutaneous edema identified. No focal subcutaneous abscess is noted. No definitive bony erosion to suggest underlying osteomyelitis is noted. The visualized cervical spine is unremarkable. Carotid calcifications are seen. A small air-fluid level is noted within the left maxillary antrum. No other bony abnormality is noted.  IMPRESSION: Changes consistent with facial cellulitis on the left in the region of the cheek and jaw. No areas to suggest subcutaneous abscess are noted. No bony erosion is seen   Electronically Signed   By: Inez Catalina M.D.   On: 09/11/2014 13:56     CBC  Recent Labs Lab 09/10/14 1258  09/11/14 0618 09/12/14 0535  WBC 11.6* 11.3* 15.1*  HGB 11.2* 9.4* 9.9*  HCT 34.1* 28.8* 30.3*  PLT 183 136* 138*  MCV 91.4 89.7 91.8  MCH 30.0 29.3 30.0  MCHC 32.8 32.6 32.7  RDW 13.1 13.5 13.8  LYMPHSABS 0.6*  --   --   MONOABS 0.5  --   --   EOSABS 0.0  --   --   BASOSABS 0.0  --   --     Chemistries   Recent Labs Lab 09/10/14 1258 09/11/14 0618 09/12/14 0535  NA 139 143 140  K 4.2 3.2* 4.4  CL 100 106 106  CO2 26 23 21   GLUCOSE 160* 110* 106*  BUN 26* 28* 28*  CREATININE 1.53* 1.51* 1.41*  CALCIUM 9.5 8.0* 8.7  AST 16 15  --   ALT 9 7  --   ALKPHOS 88 69  --   BILITOT 0.8 0.6  --    ------------------------------------------------------------------------------------------------------------------ estimated creatinine clearance is 26.8 mL/min (by C-G formula based on Cr of 1.41). ------------------------------------------------------------------------------------------------------------------ No results for input(s): HGBA1C in the last 72 hours. ------------------------------------------------------------------------------------------------------------------ No results for input(s): CHOL, HDL, LDLCALC, TRIG, CHOLHDL, LDLDIRECT in the last 72 hours. ------------------------------------------------------------------------------------------------------------------ No results for input(s): TSH, T4TOTAL, T3FREE, THYROIDAB  in the last 72 hours.  Invalid input(s): FREET3 ------------------------------------------------------------------------------------------------------------------ No results for input(s): VITAMINB12, FOLATE, FERRITIN, TIBC, IRON, RETICCTPCT in the last 72 hours.  Coagulation profile No results for input(s): INR, PROTIME in the last 168 hours.  No results for input(s): DDIMER in the last 72 hours.  Cardiac Enzymes No results for input(s): CKMB, TROPONINI, MYOGLOBIN in the last 168 hours.  Invalid input(s):  CK ------------------------------------------------------------------------------------------------------------------ Invalid input(s): POCBNP     Time Spent in minutes   35   Sumire Halbleib K M.D on 09/15/2014 at 9:54 AM  Between 7am to 7pm - Pager - [phone]  After 7pm go to [email or web address] - password [email or web address]  And look for the night coverage person covering for me after hours  [facility] Office  [phone]

## 2014-09-15 NOTE — Plan of Care (Signed)
Problem: Phase II Progression Outcomes Goal: Progress activity as tolerated unless otherwise ordered Outcome: Progressing     

## 2014-09-16 LAB — GLUCOSE, CAPILLARY
GLUCOSE-CAPILLARY: 116 mg/dL — AB (ref 70–99)
Glucose-Capillary: 117 mg/dL — ABNORMAL HIGH (ref 70–99)

## 2014-09-16 MED ORDER — ONDANSETRON HCL 4 MG PO TABS: 4.0000 mg | ORAL_TABLET | Freq: Three times a day (TID) | ORAL | Status: DC | PRN

## 2014-09-16 MED ORDER — HEPARIN SOD (PORK) LOCK FLUSH 100 UNIT/ML IV SOLN
250.0000 [IU] | INTRAVENOUS | Status: AC | PRN
  Administered 2014-09-16: 250 [IU]

## 2014-09-16 MED ORDER — MUPIROCIN 2 % EX OINT: 1.0000 "application " | TOPICAL_OINTMENT | Freq: Two times a day (BID) | CUTANEOUS | Status: AC

## 2014-09-16 MED ORDER — METFORMIN HCL 500 MG PO TABS: 500.0000 mg | ORAL_TABLET | Freq: Two times a day (BID) | ORAL | Status: DC

## 2014-09-16 MED ORDER — VANCOMYCIN HCL IN DEXTROSE 750-5 MG/150ML-% IV SOLN: 750.0000 mg | INTRAVENOUS | Status: DC

## 2014-09-16 NOTE — Discharge Summary (Addendum)
Tricia Dean, is a 78 y.o. female  DOB 1936/05/13  MRN [id number].  Admission date:  09/10/2014  Admitting Physician  Domenic Polite, MD  Discharge Date:  09/16/2014   Primary MD  Donnie Coffin, MD  Recommendations for primary care physician for things to follow:   Monitor left facial and lip cellulitis/abscess, check CBC BMP and vancomycin peak and trough levels closely   Admission Diagnosis  Facial abscess [L02.01] Facial cellulitis [L03.211]   Discharge Diagnosis  Facial abscess [L02.01] Facial cellulitis [L03.211]    Principal Problem:   Facial cellulitis Active Problems:   Hypertension   Diabetes mellitus   Renal cell cancer   Cellulitis   Facial abscess   MRSA bacteremia      Past Medical History  Diagnosis Date  . Hypertension   . Diabetes mellitus without complication   . Cancer     Renal    Past Surgical History  Procedure Laterality Date  . Abdominal hysterectomy         History of present illness and  Hospital Course:     Kindly see H&P for history of present illness and admission details, please review complete Labs, Consult reports and Test reports for all details in brief  HPI  from the history and physical done on the day of admission   Tricia Dean is a 78 y.o. female with PMH of DM-diet controlled, HTN, Renal cancer on surveillance per [facility], presents to the ER with the above complaints. She lives with her daughter who noticed area of redness on the R side of her face last night, which worsened this morning and developed a boil with some purulence. After this she vomited, was taken to Urgent care and was febrile with temp of 102 and sent to the ER. In ER, she had a small I&D with unroofing of the boil, a continued to be febrile despite IVF and Abx and [facility]  consulted   Hospital Course    1. L. Facial cellulitis with MRSA bacteremia and sepsis - post incision and drainage done in the ER on the day of admission and then ENT on 05/13/2014, also seen by infectious disease Dr. Graylon Good and Megan Salon. Placed PICC line, 2 more weeks of IV vancomycin for MRSA stop date 09-29-14. Outpatient follow-up with ENT and ID. Much improved.   2. Mild dementia. A new supportive care. She is on Aricept, at risk for delirium. Minimize narcotics and benzos.    3. Low potassium replaced.     4. GERD. Continue PPI.    5.Pre DM  - A1c 6, Low carb diet.     Discharge Condition: stable   Follow UP  Follow-up Information    Follow up with [facility].   Why:  HHRN for iv abx   Contact information:   [address] [place] [address] [phone]       Follow up with Donnie Coffin, MD. Schedule an appointment as soon as possible for a visit in 3 days.  Specialty:  Family Medicine   Contact information:   [address] [place] [address] [phone]       Follow up with Carlyle Basques, MD. Schedule an appointment as soon as possible for a visit in 1 week.   Specialty:  Infectious Diseases   Contact information:   [place] [address] [phone]       Follow up with Ruby Cola, MD. Schedule an appointment as soon as possible for a visit in 4 days.   Specialty:  Otolaryngology   Contact information:   [address] [place] [address] [phone]         Discharge Instructions  and  Discharge Medications          Discharge Instructions    Diet - low sodium heart healthy    Complete by:  As directed      Discharge instructions    Complete by:  As directed   Follow with Primary MD Donnie Coffin, MD in 3 days   Get CBC, CMP, 2 view Chest X ray checked  by Primary MD next visit.    Activity: As tolerated with Full fall  precautions use walker/cane & assistance as needed   Disposition Home    Diet: Heart Healthy Low Carb   For Heart failure patients - Check your Weight same time everyday, if you gain over 2 pounds, or you develop in leg swelling, experience more shortness of breath or chest pain, call your Primary MD immediately. Follow Cardiac Low Salt Diet and 1.8 lit/day fluid restriction.   On your next visit with your primary care physician please Get Medicines reviewed and adjusted.   Please request your Prim.MD to go over all Hospital Tests and Procedure/Radiological results at the follow up, please get all Hospital records sent to your Prim MD by signing hospital release before you go home.   If you experience worsening of your admission symptoms, develop shortness of breath, life threatening emergency, suicidal or homicidal thoughts you must seek medical attention immediately by calling 911 or calling your MD immediately  if symptoms less severe.  You Must read complete instructions/literature along with all the possible adverse reactions/side effects for all the Medicines you take and that have been prescribed to you. Take any new Medicines after you have completely understood and accpet all the possible adverse reactions/side effects.   Do not drive, operating heavy machinery, perform activities at heights, swimming or participation in water activities or provide baby sitting services if your were admitted for syncope or siezures until you have seen by Primary MD or a Neurologist and advised to do so again.  Do not drive when taking Pain medications.    Do not take more than prescribed Pain, Sleep and Anxiety Medications  Special Instructions: If you have smoked or chewed Tobacco  in the last 2 yrs please stop smoking, stop any regular Alcohol  and or any Recreational drug use.  Wear Seat belts while driving.   Please note  You were cared for by a hospitalist during your hospital stay.  If you have any questions about your discharge medications or the care you received while you were in the hospital after you are discharged, you can call the unit and asked to speak with the hospitalist on call if the hospitalist that took care of you is not available. Once you are discharged, your primary care physician will handle any further medical issues. Please  note that NO REFILLS for any discharge medications will be authorized once you are discharged, as it is imperative that you return to your primary care physician (or establish a relationship with a primary care physician if you do not have one) for your aftercare needs so that they can reassess your need for medications and monitor your lab values.     Increase activity slowly    Complete by:  As directed             Medication List    STOP taking these medications        promethazine 25 MG tablet  Commonly known as:  PHENERGAN     sucralfate 1 GM/10ML suspension  Commonly known as:  CARAFATE      TAKE these medications        donepezil 10 MG tablet  Commonly known as:  ARICEPT  Take 10 mg by mouth at bedtime.     feeding supplement (ENSURE COMPLETE) Liqd  Take 237 mLs by mouth 2 (two) times daily between meals.     mupirocin ointment 2 %  Commonly known as:  BACTROBAN  Place 1 application into the nose 2 (two) times daily.     omeprazole 40 MG capsule  Commonly known as:  PRILOSEC  Take 1 capsule (40 mg total) by mouth daily.     ondansetron 4 MG tablet  Commonly known as:  ZOFRAN  Take 1 tablet (4 mg total) by mouth every 8 (eight) hours as needed for nausea or vomiting.     traZODone 50 MG tablet  Commonly known as:  DESYREL  Take 50 mg by mouth at bedtime.     Vancomycin 750 MG/150ML Soln  Commonly known as:  VANCOCIN  Inject 150 mLs (750 mg total) into the vein daily. Please dispense 750 mg IV daily 2 week supply. On health RN to draw vancomycin peak and trough and forward to home health pharmacy and  infectious disease office of Dr. Carlyle Basques.          Diet and Activity recommendation: See Discharge Instructions above   Consults obtained - ID, ENT   Major procedures and Radiology Reports - PLEASE review detailed and final reports for all details, in brief -      TTE  - Left ventricle: The cavity size was normal. There was mild focal basal hypertrophy of the septum. Systolic function was normal. The estimated ejection fraction was in the range of 60% to 65%. Wall motion was normal; there were no regional wall motion abnormalities. - Mitral valve: Calcified annulus. There was mild regurgitation. - Left atrium: The atrium was mildly dilated. - Pulmonary arteries: Systolic pressure was moderately increased. PA peak pressure: 49 mm Hg (S).    I&D L Lower lip by Dr Simeon Craft ENT 09-13-14    Dg Chest 2 View  09/12/2014   CLINICAL DATA:  Several day history of cough and generalized weakness. Current history of hypertension and diabetes. Current history of mixed cystic and solid left lower pole renal mass likely renal cell carcinoma.  EXAM: CHEST  2 VIEW  COMPARISON:  Two-view chest x-ray 02/15/2014. CT chest 11/12/2012. One view chest x-ray 10/23/2012.  FINDINGS: AP erect and lateral images were obtained. Suboptimal inspiration accounts for crowded bronchovascular markings, especially in the bases, and accentuates the cardiac silhouette. Vague focal opacities projected over the right mid lung and right base on the AP image, not clearly localized on the lateral, not present on the 01/2014 examination.  Lungs otherwise clear. Bronchovascular markings normal. Pulmonary vascularity normal. No pleural effusions. No pneumothorax. Cardiac silhouette upper normal in size to slightly enlarged for technique and degree of inspiration, unchanged. Thoracic aorta atherosclerotic, unchanged. Degenerative changes involving the thoracic spine.  IMPRESSION: 1. Suboptimal inspiration. Patchy pneumonia in  the right lung is suspected. Followup chest x-ray after treatment is suggested to be sure the opacities resolved. 2. Stable borderline heart size. No acute cardiopulmonary disease otherwise.   Electronically Signed   By: Evangeline Dakin M.D.   On: 09/12/2014 11:41   Ct Maxillofacial W/cm  09/11/2014   CLINICAL DATA:  Left facial swelling and redness  EXAM: CT MAXILLOFACIAL WITH CONTRAST  TECHNIQUE: Multidetector CT imaging of the maxillofacial structures was performed with intravenous contrast. Multiplanar CT image reconstructions were also generated. A small metallic BB was placed on the right temple in order to reliably differentiate right from left.  CONTRAST:  92mL OMNIPAQUE IOHEXOL 300 MG/ML  SOLN  COMPARISON:  None.  FINDINGS: Skull base is contents are within normal limits. The orbits and their contents are unremarkable.  In the left cheek and jaw region, there is skin thickening and subcutaneous edema identified. No focal subcutaneous abscess is noted. No definitive bony erosion to suggest underlying osteomyelitis is noted. The visualized cervical spine is unremarkable. Carotid calcifications are seen. A small air-fluid level is noted within the left maxillary antrum. No other bony abnormality is noted.  IMPRESSION: Changes consistent with facial cellulitis on the left in the region of the cheek and jaw. No areas to suggest subcutaneous abscess are noted. No bony erosion is seen   Electronically Signed   By: Inez Catalina M.D.   On: 09/11/2014 13:56    Micro Results      Recent Results (from the past 240 hour(s))  Blood culture (routine x 2)     Status: None   Collection Time: 09/10/14  2:54 PM  Result Value Ref Range Status   Specimen Description BLOOD RIGHT ARM  Final   Special Requests BOTTLES DRAWN AEROBIC AND ANAEROBIC 5CC  Final   Culture  Setup Time   Final    09/10/2014 21:35 Performed at [facility]    Culture   Final    METHICILLIN RESISTANT STAPHYLOCOCCUS  AUREUS Note: RIFAMPIN AND GENTAMICIN SHOULD NOT BE USED AS SINGLE DRUGS FOR TREATMENT OF STAPH INFECTIONS. CRITICAL RESULT CALLED TO, READ BACK BY AND VERIFIED WITH: JESSE Navarre 09/12/14 1300 BY SMITHERSJ This organism DOES NOT demonstrate inducible  Clindamycin resistance in vitro. Note: Gram Stain Report Called to,Read Back By and Verified With: [facility] JACER 09/11/14 1035 BY SMITHERSJ Performed at [facility]    Report Status 09/13/2014 FINAL  Final   Organism ID, Bacteria METHICILLIN RESISTANT STAPHYLOCOCCUS AUREUS  Final      Susceptibility   Methicillin resistant staphylococcus aureus - MIC*    CLINDAMYCIN <=0.25 SENSITIVE Sensitive     ERYTHROMYCIN >=8 RESISTANT Resistant     GENTAMICIN <=0.5 SENSITIVE Sensitive     LEVOFLOXACIN 4 INTERMEDIATE Intermediate     OXACILLIN >=4 RESISTANT Resistant     PENICILLIN >=0.5 RESISTANT Resistant     RIFAMPIN <=0.5 SENSITIVE Sensitive     TRIMETH/SULFA <=10 SENSITIVE Sensitive     VANCOMYCIN <=0.5 SENSITIVE Sensitive     TETRACYCLINE <=1 SENSITIVE Sensitive     * METHICILLIN RESISTANT STAPHYLOCOCCUS AUREUS  Blood culture (routine x 2)     Status: None   Collection Time: 09/10/14  2:59 PM  Result Value  Ref Range Status   Specimen Description BLOOD LEFT FOREARM  Final   Special Requests BOTTLES DRAWN AEROBIC AND ANAEROBIC 5CC  Final   Culture  Setup Time   Final    09/10/2014 21:35 Performed at [facility]    Culture   Final    STAPHYLOCOCCUS AUREUS Note: SUSCEPTIBILITIES PERFORMED ON PREVIOUS CULTURE WITHIN THE LAST 5 DAYS. Note: Gram Stain Report Called to,Read Back By and Verified With: Lavone Nian 09/11/14 BY SMITHERSJ Performed at [facility]    Report Status 09/13/2014 FINAL  Final  MRSA PCR Screening     Status: Abnormal   Collection Time: 09/10/14  7:15 PM  Result Value Ref Range Status   MRSA by PCR POSITIVE (A) NEGATIVE Final    Comment:        The GeneXpert MRSA Assay (FDA approved for  NASAL specimens only), is one component of a comprehensive MRSA colonization surveillance program. It is not intended to diagnose MRSA infection nor to guide or monitor treatment for MRSA infections. RESULT CALLED TO, READ BACK BY AND VERIFIED WITH: WILEY,N RN 2213 09/10/14 MITCHELL,L   Clostridium Difficile by PCR     Status: None   Collection Time: 09/12/14  2:40 AM  Result Value Ref Range Status   C difficile by pcr NEGATIVE NEGATIVE Final  Culture, blood (routine x 2)     Status: None (Preliminary result)   Collection Time: 09/12/14 11:48 AM  Result Value Ref Range Status   Specimen Description BLOOD RIGHT ANTECUBITAL  Final   Special Requests BOTTLES DRAWN AEROBIC AND ANAEROBIC 10CC  Final   Culture  Setup Time   Final    09/12/2014 17:07 Performed at [facility]    Culture   Final           BLOOD CULTURE RECEIVED NO GROWTH TO DATE CULTURE WILL BE HELD FOR 5 DAYS BEFORE ISSUING A FINAL NEGATIVE REPORT Performed at [facility]    Report Status PENDING  Incomplete  Culture, blood (routine x 2)     Status: None (Preliminary result)   Collection Time: 09/12/14 11:55 AM  Result Value Ref Range Status   Specimen Description BLOOD LEFT HAND  Final   Special Requests BOTTLES DRAWN AEROBIC ONLY 10CC  Final   Culture  Setup Time   Final    09/12/2014 17:07 Performed at [facility]    Culture   Final           BLOOD CULTURE RECEIVED NO GROWTH TO DATE CULTURE WILL BE HELD FOR 5 DAYS BEFORE ISSUING A FINAL NEGATIVE REPORT Performed at [facility]    Report Status PENDING  Incomplete       Today   Subjective:   Rifka Ramey today has no headache,no chest abdominal pain,no new weakness tingling or numbness, feels much better wants to go home today.    Objective:   Blood pressure 154/48, pulse 72, temperature 99.6 F (37.6 C), temperature source Oral, resp. rate 18, height 5\' 2"  (1.575 m), weight 57.879 kg (127 lb 9.6 oz), SpO2 97  %.   Intake/Output Summary (Last 24 hours) at 09/16/14 1319 Last data filed at 09/16/14 0900  Gross per 24 hour  Intake     40 ml  Output      0 ml  Net     40 ml    Exam Awake Alert, Oriented x 3, No new F.N deficits, Normal affect [place].AT,PERRAL, left-sided facial swelling completely resolved, left  lower lip abscess has been drained, Supple Neck,No JVD, No cervical lymphadenopathy appriciated.  Symmetrical Chest wall movement, Good air movement bilaterally, CTAB RRR,No Gallops,Rubs or new Murmurs, No Parasternal Heave +ve B.Sounds, Abd Soft, Non tender, No organomegaly appriciated, No rebound -guarding or rigidity. No Cyanosis, Clubbing or edema, No new Rash or bruise, right arm PICC line  Data Review   CBC w Diff:  Lab Results  Component Value Date   WBC 15.1* 09/12/2014   HGB 9.9* 09/12/2014   HCT 30.3* 09/12/2014   PLT 138* 09/12/2014   LYMPHOPCT 5* 09/10/2014   MONOPCT 4 09/10/2014   EOSPCT 0 09/10/2014   BASOPCT 0 09/10/2014    CMP:  Lab Results  Component Value Date   NA 140 09/12/2014   K 4.4 09/12/2014   CL 106 09/12/2014   CO2 21 09/12/2014   BUN 28* 09/12/2014   CREATININE 1.41* 09/12/2014   PROT 6.3 09/11/2014   ALBUMIN 2.8* 09/11/2014   BILITOT 0.6 09/11/2014   ALKPHOS 69 09/11/2014   AST 15 09/11/2014   ALT 7 09/11/2014  . Lab Results  Component Value Date   HGBA1C 6.0* 09/11/2014    CBG (last 3)   Recent Labs  09/15/14 2206 09/16/14 0801 09/16/14 1209  GLUCAP 96 117* 116*      Total Time in preparing paper work, data evaluation and todays exam - 35 minutes  Lala Lund K M.D on 09/16/2014 at 1:19 PM  [facility] Office  [phone]

## 2014-09-16 NOTE — Discharge Instructions (Signed)
Follow with Primary MD Donnie Coffin, MD in 3 days   Get CBC, CMP, 2 view Chest X ray checked  by Primary MD next visit.    Activity: As tolerated with Full fall precautions use walker/cane & assistance as needed   Disposition Home    Diet: Heart Healthy   For Heart failure patients - Check your Weight same time everyday, if you gain over 2 pounds, or you develop in leg swelling, experience more shortness of breath or chest pain, call your Primary MD immediately. Follow Cardiac Low Salt Diet and 1.8 lit/day fluid restriction.   On your next visit with your primary care physician please Get Medicines reviewed and adjusted.   Please request your Prim.MD to go over all Hospital Tests and Procedure/Radiological results at the follow up, please get all Hospital records sent to your Prim MD by signing hospital release before you go home.   If you experience worsening of your admission symptoms, develop shortness of breath, life threatening emergency, suicidal or homicidal thoughts you must seek medical attention immediately by calling 911 or calling your MD immediately  if symptoms less severe.  You Must read complete instructions/literature along with all the possible adverse reactions/side effects for all the Medicines you take and that have been prescribed to you. Take any new Medicines after you have completely understood and accpet all the possible adverse reactions/side effects.   Do not drive, operating heavy machinery, perform activities at heights, swimming or participation in water activities or provide baby sitting services if your were admitted for syncope or siezures until you have seen by Primary MD or a Neurologist and advised to do so again.  Do not drive when taking Pain medications.    Do not take more than prescribed Pain, Sleep and Anxiety Medications  Special Instructions: If you have smoked or chewed Tobacco  in the last 2 yrs please stop smoking, stop any regular  Alcohol  and or any Recreational drug use.  Wear Seat belts while driving.   Please note  You were cared for by a hospitalist during your hospital stay. If you have any questions about your discharge medications or the care you received while you were in the hospital after you are discharged, you can call the unit and asked to speak with the hospitalist on call if the hospitalist that took care of you is not available. Once you are discharged, your primary care physician will handle any further medical issues. Please note that NO REFILLS for any discharge medications will be authorized once you are discharged, as it is imperative that you return to your primary care physician (or establish a relationship with a primary care physician if you do not have one) for your aftercare needs so that they can reassess your need for medications and monitor your lab values.

## 2014-09-16 NOTE — Progress Notes (Signed)
Daughter and pt verbally understood DC instructions, no questions sked

## 2014-09-18 LAB — CULTURE, BLOOD (ROUTINE X 2)
CULTURE: NO GROWTH
Culture: NO GROWTH

## 2014-09-22 ENCOUNTER — Ambulatory Visit
Admission: RE | Admit: 2014-09-22 | Discharge: 2014-09-22 | Disposition: A | Discharge status: Home / Self Care | Payer: Medicare Other | Source: Ambulatory Visit | Attending: Family Medicine | Admitting: Family Medicine

## 2014-09-22 ENCOUNTER — Other Ambulatory Visit: Payer: Self-pay | Admitting: Family Medicine

## 2014-09-22 DIAGNOSIS — R059 Cough, unspecified: Secondary | ICD-10-CM

## 2014-09-22 DIAGNOSIS — R05 Cough: Secondary | ICD-10-CM

## 2014-09-25 ENCOUNTER — Encounter: Payer: Self-pay | Admitting: Internal Medicine

## 2014-09-25 ENCOUNTER — Ambulatory Visit (INDEPENDENT_AMBULATORY_CARE_PROVIDER_SITE_OTHER): Payer: Medicare Other | Admitting: Internal Medicine

## 2014-09-25 DIAGNOSIS — B9562 Methicillin resistant Staphylococcus aureus infection as the cause of diseases classified elsewhere: Secondary | ICD-10-CM

## 2014-09-25 DIAGNOSIS — R7881 Bacteremia: Secondary | ICD-10-CM

## 2014-09-25 MED ORDER — VANCOMYCIN HCL IN DEXTROSE 750-5 MG/150ML-% IV SOLN: 750.0000 mg | INTRAVENOUS | Status: AC

## 2014-09-25 MED ORDER — CHLORHEXIDINE GLUCONATE 2 % EX LIQD: CUTANEOUS | Status: AC

## 2014-09-25 NOTE — Progress Notes (Signed)
Patient ID: Tricia Dean, female   DOB: [birth year], 78 y.o.   MRN: [id number]         [facility]  Patient Active Problem List   Diagnosis Date Noted  . Facial abscess   . MRSA bacteremia   . Facial cellulitis 09/10/2014  . Renal cell cancer 09/10/2014  . Cellulitis 09/10/2014  . Syncope 10/23/2012  . Hypertension 10/23/2012  . Diabetes mellitus 10/23/2012    Patient's Medications  New Prescriptions   CHLORHEXIDINE GLUCONATE 2 % LIQD    Use this for bathing from the neck down. After getting wet apply from the neck down and leave on for 2 minutes before rinsing off. Do this for 1 month.  Previous Medications   DONEPEZIL (ARICEPT) 10 MG TABLET    Take 10 mg by mouth at bedtime.   FEEDING SUPPLEMENT (ENSURE COMPLETE) LIQD    Take 237 mLs by mouth 2 (two) times daily between meals.   MUPIROCIN OINTMENT (BACTROBAN) 2 %    Place 1 application into the nose 2 (two) times daily.   OMEPRAZOLE (PRILOSEC) 40 MG CAPSULE    Take 1 capsule (40 mg total) by mouth daily.   ONDANSETRON (ZOFRAN) 4 MG TABLET    Take 1 tablet (4 mg total) by mouth every 8 (eight) hours as needed for nausea or vomiting.   TRAZODONE (DESYREL) 50 MG TABLET    Take 50 mg by mouth at bedtime.  Modified Medications   Modified Medication Previous Medication   VANCOMYCIN (VANCOCIN) 750 MG/150ML SOLN Vancomycin (VANCOCIN) 750 MG/150ML SOLN      Inject 150 mLs (750 mg total) into the vein daily. Please dispense 750 mg IV daily 2 week supply. On health RN to draw vancomycin peak and trough and forward to home health pharmacy and infectious disease office of Dr. Carlyle Basques.    Inject 150 mLs (750 mg total) into the vein daily. Please dispense 750 mg IV daily 2 week supply. On health RN to draw vancomycin peak and trough and forward to home health pharmacy and infectious disease office of Dr. Carlyle Basques.  Discontinued Medications   No medications on file    Subjective:  Ms. Ricklefs is in with  her daughter for her hospital follow-up visit. She has had problems with recurrent MRSA scanning bloodstream infection. She was admitted last month with a left facial abscess and MRSA bacteremia. The abscess was lanced 2. She is now completed 16 days of vancomycin therapy. Her blood cultures became negative within 48 hours. There is no evidence of endocarditis by transthoracic echocardiogram. She is feeling much better. Review of Systems: Pertinent items are noted in HPI.  Past Medical History  Diagnosis Date  . Hypertension   . Diabetes mellitus without complication   . Cancer     Renal    History  Substance Use Topics  . Smoking status: Never Smoker   . Smokeless tobacco: Never Used  . Alcohol Use: No    Family History  Problem Relation Age of Onset  . Hypertension      Allergies  Allergen Reactions  . Other     "miacin"?  . Penicillins     Tolerates Unasyn     Objective: Temp: 98.8 F (37.1 C) (12/07 1413) Temp Source: Oral (12/07 1413) BP: 170/76 mmHg (12/07 1413) Pulse Rate: 82 (12/07 1413)  General:  She is in no distress Skin:  Right arm PICC site appears normal Lungs:  clear Cor:  Regular S1  and S2 with no murmur Face: Some residual redness on left cheek but no evidence of active infection    Assessment: She is improving.  Plan: 1. Continue IV vancomycin through 09/30/14 for a total of 3 weeks 2. Mupirocin intranasal ointment 3. Chlorhexidine bathing for 1 month 4. Follow up in 1 month   Michel Bickers, MD [facility] for [place] [phone] pager   [phone] cell 09/25/2014, 3:01 PM

## 2014-10-26 ENCOUNTER — Ambulatory Visit: Payer: Medicare Other | Admitting: Internal Medicine

## 2014-10-26 ENCOUNTER — Telehealth: Payer: Self-pay | Admitting: *Deleted

## 2014-10-26 NOTE — Telephone Encounter (Signed)
Made a new MD appt.

## 2014-11-01 ENCOUNTER — Encounter: Payer: Self-pay | Admitting: Pulmonary Disease

## 2014-11-01 ENCOUNTER — Ambulatory Visit (INDEPENDENT_AMBULATORY_CARE_PROVIDER_SITE_OTHER): Payer: Medicare Other | Admitting: Pulmonary Disease

## 2014-11-01 VITALS — BP 130/78 | HR 82 | Ht 59.0 in | Wt 126.8 lb

## 2014-11-01 DIAGNOSIS — C7802 Secondary malignant neoplasm of left lung: Secondary | ICD-10-CM

## 2014-11-01 DIAGNOSIS — C7800 Secondary malignant neoplasm of unspecified lung: Secondary | ICD-10-CM | POA: Insufficient documentation

## 2014-11-01 DIAGNOSIS — C7801 Secondary malignant neoplasm of right lung: Secondary | ICD-10-CM

## 2014-11-01 NOTE — Progress Notes (Signed)
Subjective:    Patient ID: Tricia Dean, female    DOB: 03/19/1936, 79 y.o.   MRN: [id number]  HPI  79 year old never smoker with dementia referred for evaluation of multiple pulmonary nodules. She is accompanied by her daughter Tricia Dean who is very involved with her care She was noted to have a 4.1 cm left renal cystic mass on an MRI in 2014, likely cystic renal cell carcinoma. Recommended surgery by urology-Manny, but opted for surveillance instead. She had follow-up imaging since then. CT chest in 01/2014 showed less than 1 cm pulmonary nodules. CT chest and abdomen from 10/2014 were reviewed-the 4.1 cm renal cystic mass was stable. New nodules were noted in the right upper lobe and a large 1.42.8 cm nodule in the right lower lobe. Other subcentimeter nodules were also noted which had either increased in size or were new from the prior scan in 01/2014.  She denies any problems with dyspnea, wheezing or hemoptysis. Her weight is unchanged. Her dementia is progressive, and she now lives with her daughter after the death of her husband. She is able to carry out activities of daily living by herself. She had a staph bacteremia and was treated with IV antibiotics for 2 weeks with a PICC line.  Past Medical History  Diagnosis Date  . Hypertension   . Diabetes mellitus without complication   . Cancer     Renal  . Dementia     Past Surgical History  Procedure Laterality Date  . Abdominal hysterectomy      Allergies  Allergen Reactions  . Other     "miacin"?  . Penicillins     Tolerates Unasyn     History   Social History  . Marital Status: Single    Spouse Name: N/A    Number of Children: N/A  . Years of Education: N/A   Occupational History  . Not on file.   Social History Main Topics  . Smoking status: Never Smoker   . Smokeless tobacco: Never Used  . Alcohol Use: No  . Drug Use: No  . Sexual Activity: Not Currently   Other Topics Concern  . Not on file   Social  History Narrative    Family History  Problem Relation Age of Onset  . Asthma Mother   . Asthma Father   . Emphysema Father   . Emphysema Mother      Review of Systems  Constitutional: Positive for fever. Negative for unexpected weight change.  HENT: Positive for congestion. Negative for dental problem, ear pain, nosebleeds, postnasal drip, rhinorrhea, sinus pressure, sneezing, sore throat and trouble swallowing.   Eyes: Negative for redness.  Respiratory: Positive for cough. Negative for chest tightness, shortness of breath and wheezing.   Cardiovascular: Negative for palpitations and leg swelling.  Gastrointestinal: Negative for nausea and vomiting.  Genitourinary: Negative for dysuria.  Musculoskeletal: Negative for joint swelling.  Skin: Negative for rash.  Neurological: Negative for headaches.  Hematological: Does not bruise/bleed easily.  Psychiatric/Behavioral: Negative for dysphoric mood. The patient is not nervous/anxious.        Objective:   Physical Exam  Gen. Pleasant, well-nourished, in no distress, normal affect ENT - no lesions, no post nasal drip Neck: No JVD, no thyromegaly, no carotid bruits Lungs: no use of accessory muscles, no dullness to percussion, clear without rales or rhonchi  Cardiovascular: Rhythm regular, heart sounds  normal, no murmurs or gallops, no peripheral edema Abdomen: soft and non-tender, no hepatosplenomegaly, BS normal. Musculoskeletal:  No deformities, no cyanosis or clubbing Neuro:  alert, non focal       Assessment & Plan:

## 2014-11-01 NOTE — Patient Instructions (Signed)
You have cancer spread to your lungs We discussed & decided against biopsy We will focus on symptom management & refer to hospice if things get worse

## 2014-11-01 NOTE — Assessment & Plan Note (Signed)
These lung nodules are new since her last scan in 01/2014, and others have increased in size. They likely represent metastatic lesions from the renal primary. The right lower lobe nodule would be amenable to CT-guided needle biopsy. I discussed the possibilities in the biopsy procedure with her and her daughter. The patient was very clear that she does not want treatment or biopsy. Although she has dementia, she seems to have understanding of her illness, and clearly understands that this disease will ultimately kill her. She is quite accepting of her death and would like symptomatic treatment only. As such, I do not think that further imaging is necessary-we will follow her for symptomatic treatment, she can be placed on hospice if she gets worse. Her daughter was tearful but accepting of her mother's choices,

## 2014-11-02 ENCOUNTER — Ambulatory Visit: Payer: Self-pay | Admitting: Internal Medicine

## 2015-07-19 ENCOUNTER — Other Ambulatory Visit: Payer: Self-pay | Admitting: Family Medicine

## 2015-07-19 DIAGNOSIS — R410 Disorientation, unspecified: Secondary | ICD-10-CM

## 2015-07-24 ENCOUNTER — Ambulatory Visit
Admission: RE | Admit: 2015-07-24 | Discharge: 2015-07-24 | Disposition: A | Discharge status: Home / Self Care | Payer: Medicare Other | Source: Ambulatory Visit | Attending: Family Medicine | Admitting: Family Medicine

## 2015-07-24 DIAGNOSIS — R410 Disorientation, unspecified: Secondary | ICD-10-CM

## 2015-12-03 ENCOUNTER — Encounter (HOSPITAL_COMMUNITY): Payer: Self-pay | Admitting: *Deleted

## 2015-12-03 ENCOUNTER — Emergency Department (HOSPITAL_COMMUNITY): Payer: Medicare Other

## 2015-12-03 ENCOUNTER — Inpatient Hospital Stay (HOSPITAL_COMMUNITY)
Admission: EM | Admit: 2015-12-03 | Discharge: 2015-12-06 | DRG: 689 | Disposition: A | Discharge status: Hospice | Payer: Medicare Other | Attending: Internal Medicine | Admitting: Internal Medicine

## 2015-12-03 DIAGNOSIS — Z6822 Body mass index (BMI) 22.0-22.9, adult: Secondary | ICD-10-CM

## 2015-12-03 DIAGNOSIS — Z7189 Other specified counseling: Secondary | ICD-10-CM | POA: Diagnosis present

## 2015-12-03 DIAGNOSIS — I129 Hypertensive chronic kidney disease with stage 1 through stage 4 chronic kidney disease, or unspecified chronic kidney disease: Secondary | ICD-10-CM | POA: Diagnosis present

## 2015-12-03 DIAGNOSIS — B9620 Unspecified Escherichia coli [E. coli] as the cause of diseases classified elsewhere: Secondary | ICD-10-CM | POA: Diagnosis present

## 2015-12-03 DIAGNOSIS — Z515 Encounter for palliative care: Secondary | ICD-10-CM | POA: Diagnosis present

## 2015-12-03 DIAGNOSIS — R197 Diarrhea, unspecified: Secondary | ICD-10-CM | POA: Diagnosis present

## 2015-12-03 DIAGNOSIS — Z66 Do not resuscitate: Secondary | ICD-10-CM | POA: Diagnosis present

## 2015-12-03 DIAGNOSIS — E08638 Diabetes mellitus due to underlying condition with other oral complications: Secondary | ICD-10-CM | POA: Diagnosis not present

## 2015-12-03 DIAGNOSIS — E1122 Type 2 diabetes mellitus with diabetic chronic kidney disease: Secondary | ICD-10-CM | POA: Diagnosis present

## 2015-12-03 DIAGNOSIS — C7800 Secondary malignant neoplasm of unspecified lung: Secondary | ICD-10-CM | POA: Diagnosis present

## 2015-12-03 DIAGNOSIS — C649 Malignant neoplasm of unspecified kidney, except renal pelvis: Secondary | ICD-10-CM | POA: Diagnosis present

## 2015-12-03 DIAGNOSIS — N390 Urinary tract infection, site not specified: Principal | ICD-10-CM | POA: Diagnosis present

## 2015-12-03 DIAGNOSIS — G9340 Encephalopathy, unspecified: Secondary | ICD-10-CM | POA: Diagnosis not present

## 2015-12-03 DIAGNOSIS — N184 Chronic kidney disease, stage 4 (severe): Secondary | ICD-10-CM | POA: Diagnosis present

## 2015-12-03 DIAGNOSIS — R531 Weakness: Secondary | ICD-10-CM

## 2015-12-03 DIAGNOSIS — G9341 Metabolic encephalopathy: Secondary | ICD-10-CM | POA: Diagnosis present

## 2015-12-03 DIAGNOSIS — E119 Type 2 diabetes mellitus without complications: Secondary | ICD-10-CM

## 2015-12-03 DIAGNOSIS — I10 Essential (primary) hypertension: Secondary | ICD-10-CM | POA: Diagnosis present

## 2015-12-03 DIAGNOSIS — F0390 Unspecified dementia without behavioral disturbance: Secondary | ICD-10-CM | POA: Diagnosis present

## 2015-12-03 DIAGNOSIS — E860 Dehydration: Secondary | ICD-10-CM | POA: Diagnosis present

## 2015-12-03 DIAGNOSIS — E43 Unspecified severe protein-calorie malnutrition: Secondary | ICD-10-CM | POA: Diagnosis present

## 2015-12-03 LAB — URINE MICROSCOPIC-ADD ON

## 2015-12-03 LAB — CBC
HCT: 35.4 % — ABNORMAL LOW (ref 36.0–46.0)
HEMOGLOBIN: 11.6 g/dL — AB (ref 12.0–15.0)
MCH: 28.9 pg (ref 26.0–34.0)
MCHC: 32.8 g/dL (ref 30.0–36.0)
MCV: 88.3 fL (ref 78.0–100.0)
Platelets: 248 10*3/uL (ref 150–400)
RBC: 4.01 MIL/uL (ref 3.87–5.11)
RDW: 13.4 % (ref 11.5–15.5)
WBC: 10.8 10*3/uL — ABNORMAL HIGH (ref 4.0–10.5)

## 2015-12-03 LAB — GLUCOSE, CAPILLARY: Glucose-Capillary: 78 mg/dL (ref 65–99)

## 2015-12-03 LAB — URINALYSIS, ROUTINE W REFLEX MICROSCOPIC
GLUCOSE, UA: NEGATIVE mg/dL
Ketones, ur: NEGATIVE mg/dL
Nitrite: NEGATIVE
Protein, ur: NEGATIVE mg/dL
SPECIFIC GRAVITY, URINE: 1.018 (ref 1.005–1.030)
pH: 5 (ref 5.0–8.0)

## 2015-12-03 LAB — BASIC METABOLIC PANEL
Anion gap: 12 (ref 5–15)
BUN: 41 mg/dL — ABNORMAL HIGH (ref 6–20)
CALCIUM: 9.2 mg/dL (ref 8.9–10.3)
CHLORIDE: 107 mmol/L (ref 101–111)
CO2: 22 mmol/L (ref 22–32)
CREATININE: 2.09 mg/dL — AB (ref 0.44–1.00)
GFR calc Af Amer: 25 mL/min — ABNORMAL LOW (ref >60)
GFR calc non Af Amer: 22 mL/min — ABNORMAL LOW (ref >60)
GLUCOSE: 143 mg/dL — AB (ref 65–99)
Potassium: 4.3 mmol/L (ref 3.5–5.1)
Sodium: 141 mmol/L (ref 135–145)

## 2015-12-03 LAB — CBG MONITORING, ED: GLUCOSE-CAPILLARY: 133 mg/dL — AB (ref 65–99)

## 2015-12-03 MED ORDER — ONDANSETRON HCL 4 MG PO TABS: 4.0000 mg | ORAL_TABLET | Freq: Four times a day (QID) | ORAL | Status: DC | PRN

## 2015-12-03 MED ORDER — CIPROFLOXACIN IN D5W 400 MG/200ML IV SOLN
400.0000 mg | INTRAVENOUS | Status: DC
  Administered 2015-12-04: 400 mg via INTRAVENOUS
  Filled 2015-12-03 (×2): qty 200

## 2015-12-03 MED ORDER — ALUM & MAG HYDROXIDE-SIMETH 200-200-20 MG/5ML PO SUSP: 30.0000 mL | Freq: Four times a day (QID) | ORAL | Status: DC | PRN

## 2015-12-03 MED ORDER — SODIUM CHLORIDE 0.9 % IV SOLN: INTRAVENOUS | Status: DC

## 2015-12-03 MED ORDER — ONDANSETRON HCL 4 MG/2ML IJ SOLN: 4.0000 mg | Freq: Four times a day (QID) | INTRAMUSCULAR | Status: DC | PRN

## 2015-12-03 MED ORDER — HYDROMORPHONE HCL 1 MG/ML IJ SOLN: 0.5000 mg | INTRAMUSCULAR | Status: DC | PRN

## 2015-12-03 MED ORDER — ACETAMINOPHEN 325 MG PO TABS
650.0000 mg | ORAL_TABLET | Freq: Four times a day (QID) | ORAL | Status: DC | PRN
  Administered 2015-12-04 (×2): 650 mg via ORAL
  Filled 2015-12-03 (×2): qty 2

## 2015-12-03 MED ORDER — DONEPEZIL HCL 10 MG PO TABS
10.0000 mg | ORAL_TABLET | Freq: Every day | ORAL | Status: DC
  Administered 2015-12-03 – 2015-12-05 (×3): 10 mg via ORAL
  Filled 2015-12-03 (×5): qty 1

## 2015-12-03 MED ORDER — SERTRALINE HCL 50 MG PO TABS
50.0000 mg | ORAL_TABLET | Freq: Every day | ORAL | Status: DC
  Administered 2015-12-03 – 2015-12-06 (×4): 50 mg via ORAL
  Filled 2015-12-03 (×4): qty 1

## 2015-12-03 MED ORDER — ENSURE ENLIVE PO LIQD
237.0000 mL | Freq: Two times a day (BID) | ORAL | Status: DC
  Administered 2015-12-06: 237 mL via ORAL

## 2015-12-03 MED ORDER — OXYCODONE HCL 5 MG PO TABS: 5.0000 mg | ORAL_TABLET | ORAL | Status: DC | PRN

## 2015-12-03 MED ORDER — INSULIN ASPART 100 UNIT/ML ~~LOC~~ SOLN: 0.0000 [IU] | SUBCUTANEOUS | Status: DC

## 2015-12-03 MED ORDER — ENOXAPARIN SODIUM 30 MG/0.3ML ~~LOC~~ SOLN
30.0000 mg | SUBCUTANEOUS | Status: DC
  Administered 2015-12-03 – 2015-12-05 (×3): 30 mg via SUBCUTANEOUS
  Filled 2015-12-03 (×4): qty 0.3

## 2015-12-03 MED ORDER — CIPROFLOXACIN IN D5W 400 MG/200ML IV SOLN
400.0000 mg | Freq: Once | INTRAVENOUS | Status: AC
  Administered 2015-12-03: 400 mg via INTRAVENOUS
  Filled 2015-12-03: qty 200

## 2015-12-03 MED ORDER — ACETAMINOPHEN 650 MG RE SUPP: 650.0000 mg | Freq: Four times a day (QID) | RECTAL | Status: DC | PRN

## 2015-12-03 NOTE — ED Notes (Signed)
Patient has history of MRSA and kidney cancer stage 4.  Patient has dementia and lives with her daughter.  Daughter reports she had an episode of almost passing out this morning.  Patient has been having liquid stools for 3 months but in the past week it has been constant.  Family reports it has an extremely foul odor.

## 2015-12-03 NOTE — ED Notes (Signed)
Pt's daughter reports pt woke up this am with bowel incontinence which has been going on for a week.  She also reports this am, while brushing pt's hair, pt started to lean to the left with L facial droop.  She reports sitting pt on the chair and pt was leaning to the left.   She reports that pt was unable to speak and had a "frantic" look on her face.  While waiting in the waiting area, pt had another episode of bowel incontinence.

## 2015-12-03 NOTE — ED Notes (Signed)
Came out of another room, patient was walking out of her room. Pt had disconnected herself from the cardiac monitor, discontinued her IV. Redirected patient back on stretcher. Obtained another IV. Pt's daughter came back in the room while connecting patient back into cardiac monitor.

## 2015-12-03 NOTE — ED Provider Notes (Signed)
CSN: [id number]     Arrival date & time 12/03/15  1156 History   First MD Initiated Contact with Patient 12/03/15 1500     Chief Complaint  Patient presents with  . Weakness     Level 5 caveat due to dementia. Patient is a 80 y.o. female presenting with weakness. The history is provided by the patient and a relative.  Weakness   patient presents with generalized weakness. She does live with a family member but the family member copy all the time. She has a rather severe dementia but also has stage IV renal cancer that is not being treated actively. Has been doing worse the last few days. More confusion. Also his been having diarrhea for a while now pressures recently. She is now incontinent of stool. She was incontinent of stool in the ER. Earlier today was reportedly weak on the left side with some confusion. Family member states that they had to turn off the power at the house so she doesn't damage the house or hurt herself while she is alone.  Past Medical History  Diagnosis Date  . Hypertension   . Diabetes mellitus without complication ([place])   . Cancer [facility]     Renal  . Dementia    Past Surgical History  Procedure Laterality Date  . Abdominal hysterectomy     Family History  Problem Relation Age of Onset  . Asthma Mother   . Asthma Father   . Emphysema Father   . Emphysema Mother    Social History  Substance Use Topics  . Smoking status: Never Smoker   . Smokeless tobacco: Never Used  . Alcohol Use: No   OB History    No data available     Review of Systems  Unable to perform ROS: Dementia  Neurological: Positive for weakness.      Allergies  Other and Penicillins  Home Medications   Prior to Admission medications   Medication Sig Start Date End Date Taking? Authorizing Provider  BAYER MICROLET LANCETS lancets  09/22/14  Yes Historical Provider, MD  donepezil (ARICEPT) 10 MG tablet Take 10 mg by mouth at bedtime.   Yes Historical Provider, MD   HYDROcodone-acetaminophen (NORCO/VICODIN) 5-325 MG tablet Take 1 tablet by mouth every 6 (six) hours as needed for moderate pain or severe pain. for pain 11/10/15  Yes Historical Provider, MD  omeprazole (PRILOSEC) 40 MG capsule Take 1 capsule (40 mg total) by mouth daily. 10/24/12  Yes Janece Canterbury, MD  ONE TOUCH ULTRA TEST test strip  10/02/14  Yes Historical Provider, MD  sertraline (ZOLOFT) 50 MG tablet Take 50 mg by mouth daily.   Yes Historical Provider, MD  Chlorhexidine Gluconate 2 % LIQD Use this for bathing from the neck down. After getting wet apply from the neck down and leave on for 2 minutes before rinsing off. Do this for 1 month. Patient not taking: Reported on 12/03/2015 09/25/14   Michel Bickers, MD  mupirocin ointment (BACTROBAN) 2 % Place 1 application into the nose 2 (two) times daily. Patient not taking: Reported on 12/03/2015 09/16/14   Thurnell Lose, MD   BP 143/76 mmHg  Pulse 67  Temp(Src) 98.8 F (37.1 C) (Oral)  Resp 22  SpO2 98% Physical Exam  Constitutional: She appears well-developed.  HENT:  Head: Atraumatic.  Cardiovascular: Normal rate.   Pulmonary/Chest: Effort normal.  Abdominal: Soft. There is no tenderness.  Neurological: She is alert.  Patient is demented. Awake and pleasant but unable  to identify her family member or really participate in the history. Face is grossly symmetric but may have slight right-sided facial droop.  Skin: Skin is warm.    ED Course  Procedures (including critical care time) Labs Review Labs Reviewed  BASIC METABOLIC PANEL - Abnormal; Notable for the following:    Glucose, Bld 143 (*)    BUN 41 (*)    Creatinine, Ser 2.09 (*)    GFR calc non Af Amer 22 (*)    GFR calc Af Amer 25 (*)    All other components within normal limits  CBC - Abnormal; Notable for the following:    WBC 10.8 (*)    Hemoglobin 11.6 (*)    HCT 35.4 (*)    All other components within normal limits  URINALYSIS, ROUTINE W REFLEX MICROSCOPIC  (NOT AT [facility]) - Abnormal; Notable for the following:    Color, Urine AMBER (*)    APPearance CLOUDY (*)    Hgb urine dipstick LARGE (*)    Bilirubin Urine SMALL (*)    Leukocytes, UA MODERATE (*)    All other components within normal limits  URINE MICROSCOPIC-ADD ON - Abnormal; Notable for the following:    Squamous Epithelial / LPF 0-5 (*)    Bacteria, UA MANY (*)    All other components within normal limits  CBG MONITORING, ED - Abnormal; Notable for the following:    Glucose-Capillary 133 (*)    All other components within normal limits  URINE CULTURE    Imaging Review Dg Chest 1 View  12/03/2015  CLINICAL DATA:  Altered mental status and weakness. EXAM: CHEST 1 VIEW COMPARISON:  09/22/2014 chest radiograph. FINDINGS: Stable cardiomediastinal silhouette with top-normal heart size. No pneumothorax. No pleural effusion. Lungs appear clear, with no acute consolidative airspace disease and no pulmonary edema. IMPRESSION: No active disease. Electronically Signed   By: Ilona Sorrel M.D.   On: 12/03/2015 16:08   Ct Head Wo Contrast  12/03/2015  CLINICAL DATA:  Altered mental status.  Weakness. EXAM: CT HEAD WITHOUT CONTRAST TECHNIQUE: Contiguous axial images were obtained from the base of the skull through the vertex without intravenous contrast. COMPARISON:  07/24/2015 head CT. FINDINGS: Images are partially motion degraded at the vertex. No evidence of parenchymal hemorrhage or extra-axial fluid collection. No mass lesion, mass effect, or midline shift. No CT evidence of acute infarction. Diffuse cerebral volume loss. Intracranial atherosclerosis. Nonspecific mild stable subcortical and periventricular white matter hypodensity, most in keeping with chronic small vessel ischemic change. Stable tiny lacune in the left thalamus. No ventriculomegaly. Stable small mucous retention cyst versus polyp in the posterior left maxillary sinus. The mastoid air cells are unopacified. No evidence of calvarial  fracture. IMPRESSION: 1.  No evidence of acute intracranial abnormality. 2. Intracranial atherosclerosis, diffuse cerebral volume loss and mild chronic small vessel ischemic white matter change. Stable tiny left thalamic lacune. Electronically Signed   By: Ilona Sorrel M.D.   On: 12/03/2015 16:18   I have personally reviewed and evaluated these images and lab results as part of my medical decision-making.   EKG Interpretation None     ED ECG REPORT   Date: 12/03/2015  Rate: 77  Rhythm: normal sinus rhythm  QRS Axis: normal  Intervals: normal  ST/T Wave abnormalities: normal  Conduction Disutrbances:none  Narrative Interpretation:   Old EKG Reviewed: unchanged   MDM   Final diagnoses:  Urinary tract infection without hematuria, site unspecified  Malignant neoplasm metastatic to lung, unspecified laterality (  [place])  Renal cell cancer, unspecified laterality [facility])    Patient resented with worsening mental status. Weakness and diarrhea. Has been incontinent of stool recently also. Has apparent UTI. Has not been doing well at home and may require placement after discharge. Has known metastatic cancer that is not currently being treated.    Davonna Belling, MD 12/03/15 [id number]

## 2015-12-03 NOTE — H&P (Signed)
[facility] Admission History and Physical       Tricia Dean [id number] DOB: 1936/01/01 DOA: 12/03/2015  Referring physician:  EDP PCP: Donnie Coffin, MD  Specialists:   Chief Complaint: Increased Confusion and Weakness  HPI: Tricia Dean is a 80 y.o. female with a history of Untreated Renal Cell Carcinoma diagnosed in 2011 who presents to the ED with complaints of increased weakness , confusion, and worsening diarrhea and incontinence over the past week.  She has had worsening of diarrhea over 3 months.    She denies any fevers or chills.    She has had poor appetite and overall decline over the past month.    She was evaluated in the ED and found to have a UTI, and a Urine C+S was sent and she was placed on IV Cipro.  A C.diff PCR was also sent and she was placed on Enteric Precautions.   Code Status has been discussed and patient decided to be a DoNOT Resuscitate, and wishes to have a Palliative care consultation while hospitalized.    Her daughter is at the bedside and assists with the history.     Review of Systems:  Constitutional: No Weight Loss, No Weight Gain, Night Sweats, Fevers, Chills, Dizziness, Light Headedness, Fatigue, + Generalized Weakness HEENT: No Headaches, Difficulty Swallowing,Tooth/Dental Problems,Sore Throat,  No Sneezing, Rhinitis, Ear Ache, Nasal Congestion, or Post Nasal Drip,  Cardio-vascular:  No Chest pain, Orthopnea, PND, Edema in Lower Extremities, Anasarca, Dizziness, Palpitations  Resp: No Dyspnea, No DOE, No Productive Cough, No Non-Productive Cough, No Hemoptysis, No Wheezing.    GI: No Heartburn, Indigestion, Abdominal Pain, Nausea, Vomiting, +Diarrhea, Constipation, Hematemesis, Hematochezia, Melena, Change in Bowel Habits,  +Loss of Appetite  GU: No Dysuria, No Change in Color of Urine, No Urgency or Urinary Frequency, No Flank pain.  Musculoskeletal: No Joint Pain or Swelling, No Decreased Range of Motion, No Back Pain.  Neurologic:  No Syncope, No Seizures, Muscle Weakness, Paresthesia, Vision Disturbance or Loss, No Diplopia, No Vertigo, No Difficulty Walking,  Skin: No Rash or Lesions. Psych: No Change in Mood or Affect, No Depression or Anxiety, No Memory loss, +Confusion, or Hallucinations   Past Medical History  Diagnosis Date  . Hypertension   . Diabetes mellitus without complication ([place])   . Cancer [facility])     Renal  . Dementia      Past Surgical History  Procedure Laterality Date  . Abdominal hysterectomy        Prior to Admission medications   Medication Sig Start Date End Date Taking? Authorizing Provider  BAYER MICROLET LANCETS lancets  09/22/14  Yes Historical Provider, MD  donepezil (ARICEPT) 10 MG tablet Take 10 mg by mouth at bedtime.   Yes Historical Provider, MD  HYDROcodone-acetaminophen (NORCO/VICODIN) 5-325 MG tablet Take 1 tablet by mouth every 6 (six) hours as needed for moderate pain or severe pain. for pain 11/10/15  Yes Historical Provider, MD  omeprazole (PRILOSEC) 40 MG capsule Take 1 capsule (40 mg total) by mouth daily. 10/24/12  Yes Janece Canterbury, MD  ONE TOUCH ULTRA TEST test strip  10/02/14  Yes Historical Provider, MD  sertraline (ZOLOFT) 50 MG tablet Take 50 mg by mouth daily.   Yes Historical Provider, MD  Chlorhexidine Gluconate 2 % LIQD Use this for bathing from the neck down. After getting wet apply from the neck down and leave on for 2 minutes before rinsing off. Do this for 1 month. Patient not taking: Reported on 12/03/2015  09/25/14   Michel Bickers, MD  mupirocin ointment (BACTROBAN) 2 % Place 1 application into the nose 2 (two) times daily. Patient not taking: Reported on 12/03/2015 09/16/14   Thurnell Lose, MD     Allergies  Allergen Reactions  . Other     "miacin"?  . Penicillins Other (See Comments)    Tolerates Unasyn  Has patient had a PCN reaction causing immediate rash, facial/tongue/throat swelling, SOB or lightheadedness with hypotension: yes Has patient  had a PCN reaction causing severe rash involving mucus membranes or skin necrosis: no Has patient had a PCN reaction occurring within the last 10 years: no If all of the above answers are "NO", then may proceed with Cephalosporin use.     Social History:  reports that she has never smoked. She has never used smokeless tobacco. She reports that she does not drink alcohol or use illicit drugs.    Family History  Problem Relation Age of Onset  . Asthma Mother   . Asthma Father   . Emphysema Father   . Emphysema Mother        Physical Exam:  GEN:  Pleasant Thin Elderly Ill Appearing   80 y.o. Caucasian female examined and in no acute distress; cooperative with exam Filed Vitals:   12/03/15 1224 12/03/15 1232 12/03/15 1529 12/03/15 1614  BP: 128/70 127/72 143/69 158/95  Pulse: 67 64 77 91  Temp: 98.4 F (36.9 C) 98.7 F (37.1 C)  98.8 F (37.1 C)  TempSrc: Oral Oral  Oral  Resp:  17 18 20   SpO2: 99% 98% 96% 98%   Blood pressure 158/95, pulse 91, temperature 98.8 F (37.1 C), temperature source Oral, resp. rate 20, SpO2 98 %. PSYCH: She is alert and oriented x 2; does not appear anxious does not appear depressed; affect is normal HEENT: Normocephalic and Atraumatic, Mucous membranes pink; PERRLA; EOM intact; Fundi:  Benign;  No scleral icterus, Nares: Patent, Oropharynx: Clear, Edentulous on Lower,    Neck:  FROM, No Cervical Lymphadenopathy nor Thyromegaly or Carotid Bruit; No JVD; Breasts:: Not examined CHEST WALL: No tenderness CHEST: Normal respiration, clear to auscultation bilaterally HEART: Regular rate and rhythm; no murmurs rubs or gallops BACK: No kyphosis or scoliosis; No CVA tenderness ABDOMEN: Positive Bowel Sounds, Scaphoid, Obese, Soft Non-Tender, No Rebound or Guarding; No Masses, No Organomegaly, No Pannus; No Intertriginous candida. Rectal Exam: Not done EXTREMITIES: No Cyanosis, Clubbing, or Edema; No Ulcerations. Genitalia: not examined PULSES: 2+ and  symmetric SKIN: Normal hydration no rash or ulceration CNS:  Alert and Oriented x 2, No Focal Deficits Vascular: pulses palpable throughout    Labs on Admission:  Basic Metabolic Panel:  Recent Labs Lab 12/03/15 1332  NA 141  K 4.3  CL 107  CO2 22  GLUCOSE 143*  BUN 41*  CREATININE 2.09*  CALCIUM 9.2   Liver Function Tests: No results for input(s): AST, ALT, ALKPHOS, BILITOT, PROT, ALBUMIN in the last 168 hours. No results for input(s): LIPASE, AMYLASE in the last 168 hours. No results for input(s): AMMONIA in the last 168 hours. CBC:  Recent Labs Lab 12/03/15 1332  WBC 10.8*  HGB 11.6*  HCT 35.4*  MCV 88.3  PLT 248   Cardiac Enzymes: No results for input(s): CKTOTAL, CKMB, CKMBINDEX, TROPONINI in the last 168 hours.  BNP (last 3 results) No results for input(s): BNP in the last 8760 hours.  ProBNP (last 3 results) No results for input(s): PROBNP in the last 8760 hours.  CBG:  Recent Labs Lab 12/03/15 1331  GLUCAP 133*    Radiological Exams on Admission: Dg Chest 1 View  12/03/2015  CLINICAL DATA:  Altered mental status and weakness. EXAM: CHEST 1 VIEW COMPARISON:  09/22/2014 chest radiograph. FINDINGS: Stable cardiomediastinal silhouette with top-normal heart size. No pneumothorax. No pleural effusion. Lungs appear clear, with no acute consolidative airspace disease and no pulmonary edema. IMPRESSION: No active disease. Electronically Signed   By: Ilona Sorrel M.D.   On: 12/03/2015 16:08   Ct Head Wo Contrast  12/03/2015  CLINICAL DATA:  Altered mental status.  Weakness. EXAM: CT HEAD WITHOUT CONTRAST TECHNIQUE: Contiguous axial images were obtained from the base of the skull through the vertex without intravenous contrast. COMPARISON:  07/24/2015 head CT. FINDINGS: Images are partially motion degraded at the vertex. No evidence of parenchymal hemorrhage or extra-axial fluid collection. No mass lesion, mass effect, or midline shift. No CT evidence of acute  infarction. Diffuse cerebral volume loss. Intracranial atherosclerosis. Nonspecific mild stable subcortical and periventricular white matter hypodensity, most in keeping with chronic small vessel ischemic change. Stable tiny lacune in the left thalamus. No ventriculomegaly. Stable small mucous retention cyst versus polyp in the posterior left maxillary sinus. The mastoid air cells are unopacified. No evidence of calvarial fracture. IMPRESSION: 1.  No evidence of acute intracranial abnormality. 2. Intracranial atherosclerosis, diffuse cerebral volume loss and mild chronic small vessel ischemic white matter change. Stable tiny left thalamic lacune. Electronically Signed   By: Ilona Sorrel M.D.   On: 12/03/2015 16:18     EKG: Independently reviewed. NSR rate = 77, No Acute Changes           Assessment/Plan:      80 y.o. female with  Principal Problem:    1.    Weakness   IVFs   IV Abx for UTI   Evaluate Nutrition Needs   Active Problems:    2.    Acute encephalopathy - due to #1 and #2      3.    UTI (lower urinary tract infection)    Urine C+S Sent    Empiric IV Cipro    4.    Diarrhea    Enteric Precautions    C.Diff PCR sent      5.    Hypertension    Monitor BPs      6.    Diabetes mellitus (HCC)    SSI coverage PRN    Check HbA1C    7.    Malnutrition    Nutrition consult     8.    Renal cell cancer ([place])    Opted not to have Treatments      9.    DVT Prophylaxis    Lovenox    Code Status:    DO NOT RESUSCITATE (DNR)      Family Communication:   Daughter at Bedside   Disposition Plan:    Inpatient Status with Expected LOS of 2 -3 days  Time spent: 71 Minutes      Theressa Millard [facility] Pager [phone]   If [place] Please Contact the Day Rounding Team MD for [facility]  If 7PM-7AM, Please Contact Night-Floor Coverage  [email or web address] Password [email or web address] 12/03/2015, 4:50 PM     ADDENDUM:   Patient was seen and examined on 12/03/2015

## 2015-12-03 NOTE — ED Notes (Signed)
Pt can go to floor at 17:35 

## 2015-12-04 DIAGNOSIS — Z7189 Other specified counseling: Secondary | ICD-10-CM | POA: Diagnosis present

## 2015-12-04 DIAGNOSIS — N184 Chronic kidney disease, stage 4 (severe): Secondary | ICD-10-CM | POA: Diagnosis present

## 2015-12-04 DIAGNOSIS — Z515 Encounter for palliative care: Secondary | ICD-10-CM | POA: Diagnosis present

## 2015-12-04 DIAGNOSIS — C7800 Secondary malignant neoplasm of unspecified lung: Secondary | ICD-10-CM | POA: Diagnosis present

## 2015-12-04 LAB — BASIC METABOLIC PANEL
Anion gap: 12 (ref 5–15)
BUN: 40 mg/dL — AB (ref 6–20)
CO2: 22 mmol/L (ref 22–32)
CREATININE: 1.95 mg/dL — AB (ref 0.44–1.00)
Calcium: 9 mg/dL (ref 8.9–10.3)
Chloride: 108 mmol/L (ref 101–111)
GFR calc Af Amer: 27 mL/min — ABNORMAL LOW (ref >60)
GFR, EST NON AFRICAN AMERICAN: 24 mL/min — AB (ref >60)
GLUCOSE: 98 mg/dL (ref 65–99)
POTASSIUM: 4.2 mmol/L (ref 3.5–5.1)
Sodium: 142 mmol/L (ref 135–145)

## 2015-12-04 LAB — GLUCOSE, CAPILLARY
GLUCOSE-CAPILLARY: 88 mg/dL (ref 65–99)
GLUCOSE-CAPILLARY: 100 mg/dL — AB (ref 65–99)
GLUCOSE-CAPILLARY: 109 mg/dL — AB (ref 65–99)
GLUCOSE-CAPILLARY: 81 mg/dL (ref 65–99)
Glucose-Capillary: 85 mg/dL (ref 65–99)

## 2015-12-04 LAB — MRSA PCR SCREENING: MRSA BY PCR: NEGATIVE

## 2015-12-04 LAB — CBC
HEMATOCRIT: 34.4 % — AB (ref 36.0–46.0)
Hemoglobin: 11 g/dL — ABNORMAL LOW (ref 12.0–15.0)
MCH: 29.4 pg (ref 26.0–34.0)
MCHC: 32 g/dL (ref 30.0–36.0)
MCV: 92 fL (ref 78.0–100.0)
PLATELETS: 224 10*3/uL (ref 150–400)
RBC: 3.74 MIL/uL — ABNORMAL LOW (ref 3.87–5.11)
RDW: 14 % (ref 11.5–15.5)
WBC: 6.9 10*3/uL (ref 4.0–10.5)

## 2015-12-04 MED ORDER — INSULIN ASPART 100 UNIT/ML ~~LOC~~ SOLN
0.0000 [IU] | Freq: Three times a day (TID) | SUBCUTANEOUS | Status: DC
  Administered 2015-12-05: 3 [IU] via SUBCUTANEOUS
  Administered 2015-12-06: 1 [IU] via SUBCUTANEOUS

## 2015-12-04 MED ORDER — BOOST / RESOURCE BREEZE PO LIQD
1.0000 | Freq: Three times a day (TID) | ORAL | Status: DC
Start: 2015-12-04 — End: 2015-12-06
  Administered 2015-12-04 – 2015-12-06 (×6): 1 via ORAL

## 2015-12-04 NOTE — Clinical Documentation Improvement (Signed)
  Hospitalist  Can the diagnosis of acute renal failure be further specified?   Acute Renal Failure/Acute Kidney Injury  Acute Tubular Necrosis  Acute Renal Cortical Necrosis  Acute Renal Medullary Necrosis  Acute on Chronic Renal Failure  Chronic Renal Failure  Other  Clinically Undetermined  Document any associated diagnoses/conditions.   Supporting Information: BUN on admission was 41, 40; Creatinine on admission was 2.09 1.92; GFR was 22 and 24   Please exercise your independent, professional judgment when responding. A specific answer is not anticipated or expected.   Thank You,  [place] [phone]

## 2015-12-04 NOTE — Progress Notes (Signed)
Initial Nutrition Assessment  INTERVENTION:   -Provide Boost Breeze po TID, each supplement provides 250 kcal and 9 grams of protein while patient is on clear liquids. -Continue order for Ensure Enlive po BID, each supplement provides 350 kcal and 20 grams of protein -Encourage PO intake  NUTRITION DIAGNOSIS:   Inadequate oral intake related to poor appetite as evidenced by meal completion < 25% (Clear liquid diet).  GOAL:   Patient will meet greater than or equal to 90% of their needs  MONITOR:   PO intake, Supplement acceptance, Diet advancement, Labs, Weight trends, I & O's  REASON FOR ASSESSMENT:   Consult Assessment of nutrition requirement/status  ASSESSMENT:   80 y.o. female with a history of Untreated Renal Cell Carcinoma diagnosed in 2011 who presents to the ED with complaints of increased weakness , confusion, and worsening diarrhea and incontinence over the past week. She has had worsening of diarrhea over 3 months. She denies any fevers or chills. She has had poor appetite and overall decline over the past month.  Patient in room with daughter at bedside. Per daughter, pt has not been eating well for a month now and has had worsening diarrhea for the past 3 months. She continues to have loose stools now. Pt is taking in very little of her clear liquid diet, she had 1/2 cup of cranberry juice and some water. Pt has been ordered Ensure supplements but has not been provided d/t current diet. RD to order Boost Breeze until diet is advanced. Per daughter, pt was order Ensure in the past and did drink them.  Per weight history, pt has lost 16 lb since 11/01/14 (13% wt loss x >1 year, insignificant for time frame). Unable to perform NFPE d/t patient's confusion. Currently with mitts on.  Labs reviewed: Elevated BUN & Creatinine  Diet Order:  Diet clear liquid Room service appropriate?: Yes; Fluid consistency:: Thin  Skin:  Reviewed, no issues  Last BM:   2/13  Height:   Ht Readings from Last 1 Encounters:  12/03/15 4\' 11"  (1.499 m)    Weight:   Wt Readings from Last 1 Encounters:  12/03/15 110 lb 7.2 oz (50.1 kg)    Ideal Body Weight:  44.8 kg  BMI:  Body mass index is 22.3 kg/(m^2).  Estimated Nutritional Needs:   Kcal:  1400-1600  Protein:  60-70g  Fluid:  1.5L/day  EDUCATION NEEDS:   No education needs identified at this time  Clayton Bibles, MS, RD, LDN Pager: [phone] After Hours Pager: [phone]

## 2015-12-04 NOTE — Consult Note (Signed)
Consultation Note Date: 12/04/2015   Patient Name: Tricia Dean  DOB: 1936-07-21  MRN: [id number]  Age / Sex: 80 y.o., female  PCP: L.Donnie Coffin, MD Referring Physician: Janece Canterbury, MD  Reason for Consultation: Establishing goals of care    Clinical Assessment/Narrative:   TAMMEY TUNNEY is a 80 y.o. female with a history of Untreated Renal Cell Carcinoma diagnosed in 2011, recently seen by pulmonary for lung nodules deemed likely metastatic lesions from her renal primary. Patient has been clear about not undergoing any diagnostic work up. She elected surveillance and no biopsies. She has had gradual decline for past 6 months,ongoing diarrhea, recent UTI. Daughter [name] is primary caregiver and primary historian. She is on IVF, antibiotics now. She has underlying DM, HTN and dementia.    Palliative consult for goals of care discussions. She is resting in bed, is able to state her name, but pleasantly confused. She is in no distress. She has been trying to d/c her peripheral IV. Daughter states patient has been not eating well, has dehydration. She has worsening mental status at home.   discussed about hospice services. Daughter is agreeable to hospice support. She lives in [place]. Case manager consult for home with hospice on discharge. Discussions regarding trajectories with cancer and dementia discussed in detail with daughter.   Contacts/Participants in Discussion: Primary Decision Maker:     Relationship to Patient   HCPOA: yes     SUMMARY OF RECOMMENDATIONS: DNR DNI Hospice consult Home with hospice under the care of her daughter at discharge Prognosis weeks to months   Code Status/Advance Care Planning: DNR    Code Status Orders        Start     Ordered   12/03/15 1713  Do not attempt resuscitation (DNR)   Continuous    Question Answer Comment  In the event of cardiac or  respiratory ARREST Do not call a "code blue"   In the event of cardiac or respiratory ARREST Do not perform Intubation, CPR, defibrillation or ACLS   In the event of cardiac or respiratory ARREST Use medication by any route, position, wound care, and other measures to relive pain and suffering. May use oxygen, suction and manual treatment of airway obstruction as needed for comfort.      12/03/15 1712    Code Status History    Date Active Date Inactive Code Status Order ID Comments User Context   09/10/2014  5:47 PM 09/16/2014  4:54 PM Full Code [id number]  Domenic Polite, MD Inpatient   10/23/2012  5:17 AM 10/24/2012  6:34 PM Full Code [id number]  Morenikeji Ivery Quale, RN Inpatient      Other Directives:Advanced Directive  Symptom Management:    as above   Palliative Prophylaxis:   Delirium Protocol  Psycho-social/Spiritual:  Support System: [place] Desire for further Chaplaincy support:no Additional Recommendations: Caregiving  Support/Resources  Prognosis: < 3 months  Discharge Planning: Home with Hospice   Chief Complaint/ Primary Diagnoses: Present on Admission:  . UTI (lower urinary tract infection) . Renal cell cancer ([place]) . Hypertension . Diarrhea . Protein-calorie malnutrition, severe ([place])  I have reviewed the medical record, interviewed the patient and family, and examined the patient. The following aspects are pertinent.  Past Medical History  Diagnosis Date  . Hypertension   . Diabetes mellitus without complication ([place])   . Cancer [facility])     Renal  . Dementia    Social History   Social History  . Marital Status:  Single    Spouse Name: N/A  . Number of Children: N/A  . Years of Education: N/A   Social History Main Topics  . Smoking status: Never Smoker   . Smokeless tobacco: Never Used  . Alcohol Use: No  . Drug Use: No  . Sexual Activity: Not Currently   Other Topics Concern  . None   Social History Narrative   Family History  Problem  Relation Age of Onset  . Asthma Mother   . Asthma Father   . Emphysema Father   . Emphysema Mother    Scheduled Meds: . ciprofloxacin  400 mg Intravenous Q24H  . donepezil  10 mg Oral QHS  . enoxaparin (LOVENOX) injection  30 mg Subcutaneous Q24H  . feeding supplement (ENSURE ENLIVE)  237 mL Oral BID BM  . insulin aspart  0-9 Units Subcutaneous 6 times per day  . sertraline  50 mg Oral Daily   Continuous Infusions: . sodium chloride     PRN Meds:.acetaminophen **OR** acetaminophen, alum & mag hydroxide-simeth, HYDROmorphone (DILAUDID) injection, ondansetron **OR** ondansetron (ZOFRAN) IV, oxyCODONE Medications Prior to Admission:  Prior to Admission medications   Medication Sig Start Date End Date Taking? Authorizing Provider  BAYER MICROLET LANCETS lancets  09/22/14  Yes Historical Provider, MD  donepezil (ARICEPT) 10 MG tablet Take 10 mg by mouth at bedtime.   Yes Historical Provider, MD  HYDROcodone-acetaminophen (NORCO/VICODIN) 5-325 MG tablet Take 1 tablet by mouth every 6 (six) hours as needed for moderate pain or severe pain. for pain 11/10/15  Yes Historical Provider, MD  omeprazole (PRILOSEC) 40 MG capsule Take 1 capsule (40 mg total) by mouth daily. 10/24/12  Yes Janece Canterbury, MD  ONE TOUCH ULTRA TEST test strip  10/02/14  Yes Historical Provider, MD  sertraline (ZOLOFT) 50 MG tablet Take 50 mg by mouth daily.   Yes Historical Provider, MD  Chlorhexidine Gluconate 2 % LIQD Use this for bathing from the neck down. After getting wet apply from the neck down and leave on for 2 minutes before rinsing off. Do this for 1 month. Patient not taking: Reported on 12/03/2015 09/25/14   Michel Bickers, MD  mupirocin ointment (BACTROBAN) 2 % Place 1 application into the nose 2 (two) times daily. Patient not taking: Reported on 12/03/2015 09/16/14   Thurnell Lose, MD   Allergies  Allergen Reactions  . Other     "miacin"?  . Penicillins Other (See Comments)    Tolerates Unasyn  Has  patient had a PCN reaction causing immediate rash, facial/tongue/throat swelling, SOB or lightheadedness with hypotension: yes Has patient had a PCN reaction causing severe rash involving mucus membranes or skin necrosis: no Has patient had a PCN reaction occurring within the last 10 years: no If all of the above answers are "NO", then may proceed with Cephalosporin use.     Review of Systems Denies pain  Physical Exam Weak elderly lady Is able to state name confused Has been trying to d/c her peripheral iv S1 S2 Abdomen soft Lungs clear No edema  Vital Signs: BP 135/99 mmHg  Pulse 90  Temp(Src) 98.9 F (37.2 C) (Oral)  Resp 16  Ht 4\' 11"  (1.499 m)  Wt 50.1 kg (110 lb 7.2 oz)  BMI 22.30 kg/m2  SpO2 95%  SpO2: SpO2: 95 % O2 Device:SpO2: 95 % O2 Flow Rate: .   IO: Intake/output summary:  Intake/Output Summary (Last 24 hours) at 12/04/15 1421 Last data filed at 12/04/15 0500  Gross per 24  hour  Intake      0 ml  Output    301 ml  Net   -301 ml    LBM: Last BM Date: 12/03/15 Baseline Weight: Weight: 50.1 kg (110 lb 7.2 oz) Most recent weight: Weight: 50.1 kg (110 lb 7.2 oz)      Palliative Assessment/Data:  Flowsheet Rows        Most Recent Value   Intake Tab    Referral Department  Hospitalist   Unit at Time of Referral  Med/Surg Unit   Palliative Care Primary Diagnosis  Cancer   Date Notified  12/03/15   Palliative Care Type  New Palliative care   Reason for referral  Clarify Goals of Care, Counsel Regarding Hospice   Date of Admission  12/03/15   Date first seen by Palliative Care  12/04/15   # of days Palliative referral response time  1 Day(s)   # of days IP prior to Palliative referral  0   Clinical Assessment    Palliative Performance Scale Score  30%   Pain Max last 24 hours  5   Pain Min Last 24 hours  4   Dyspnea Max Last 24 Hours  5   Dyspnea Min Last 24 hours  4   Psychosocial & Spiritual Assessment    Palliative Care Outcomes     Patient/Family meeting held?  Yes   Palliative Care Outcomes  Clarified goals of care   Palliative Care follow-up planned  Yes, Facility      Additional Data Reviewed:  CBC:    Component Value Date/Time   WBC 6.9 12/04/2015 0455   HGB 11.0* 12/04/2015 0455   HCT 34.4* 12/04/2015 0455   PLT 224 12/04/2015 0455   MCV 92.0 12/04/2015 0455   NEUTROABS 10.5* 09/10/2014 1258   LYMPHSABS 0.6* 09/10/2014 1258   MONOABS 0.5 09/10/2014 1258   EOSABS 0.0 09/10/2014 1258   BASOSABS 0.0 09/10/2014 1258   Comprehensive Metabolic Panel:    Component Value Date/Time   NA 142 12/04/2015 0455   K 4.2 12/04/2015 0455   CL 108 12/04/2015 0455   CO2 22 12/04/2015 0455   BUN 40* 12/04/2015 0455   CREATININE 1.95* 12/04/2015 0455   GLUCOSE 98 12/04/2015 0455   CALCIUM 9.0 12/04/2015 0455   AST 15 09/11/2014 0618   ALT 7 09/11/2014 0618   ALKPHOS 69 09/11/2014 0618   BILITOT 0.6 09/11/2014 0618   PROT 6.3 09/11/2014 0618   ALBUMIN 2.8* 09/11/2014 0618     Time In: 9 Time Out: 10 Time Total: 60 min  Greater than 50%  of this time was spent counseling and coordinating care related to the above assessment and plan.  Signed by: Loistine Chance, MD [id number] Loistine Chance, MD  12/04/2015, 2:21 PM  Please contact Palliative Medicine Team phone at [phone] for questions and concerns.

## 2015-12-04 NOTE — Progress Notes (Signed)
[facility] PROGRESS NOTE  Tricia Dean [id number] DOB: 06/30/1936 DOA: 12/03/2015 PCP: Donnie Coffin, MD  Brief Summary  The patient is a 80 year old female with history of untreated renal cell carcinoma diagnosed in 2011, dementia, who presented with progressively worsening weakness, confusion, diarrhea with stool incontinence over the last 3 months. She also had decreased appetite and weight loss. She was seen in the emergency department and found to have a urinary tract infection.  CT head demonstrated no acute intracranial abnormality. She was started on antibiotics. A C. difficile post PCR was sent and she was placed on enteric precautions. Her daughter asked for a palliative care consult and possible hospice.  Assessment/Plan  Progressive weakness, likely secondary to progressive malignancy and dementia -  Falls precautions -  Anticipate home with hospice  Acute metabolic encephalopathy secondary to urinary tract infection and dehydration -  Treat UTI and continue IV fluids  Escherichia coli urinary tract infection, present at the time of admission -  Follow-up urine culture -  Continue ciprofloxacin  Chronic diarrhea -  C. difficile PCR ordered, but no stool since admission -  Advance diet  Hypertension, BP only mildly elevated and GOC for comfort  Diabetes mellitus, CBG low normal -  Change to [facility] low dose SSI and consider d/c altogether if CBG remain low  Untreated renal cell carcinoma, comfort measures only  Chronic any disease stage IV, creatinine stable around 2 -  Decrease IVF  DIET:  Advance to dysphagia 2 Access:  PIV IVF:  yes Proph:  lovenox  Code Status: DNR/DNI Family Communication: patient and her daughter Disposition Plan:  Home with hospice likely tomorrow once hospice services are set up with oral antibiotics and medications for comfort.  C diff PCR has not been drawn yet.   Consultants:  Palliative care   Procedures:  CT  head  Antibiotics:  cipro 2/13 >  HPI/Subjective:  Denies abdominal pain, sob, chest pain.  Mild nausea.  No vomiting and no BMs since admission  Objective: Filed Vitals:   12/03/15 1800 12/04/15 0052 12/04/15 0500 12/04/15 1217  BP:  130/60 132/74 135/99  Pulse:  102 78 90  Temp:  98.2 F (36.8 C) 98.8 F (37.1 C) 98.9 F (37.2 C)  TempSrc:  Oral Oral Oral  Resp:  15 16 16   Height: 4\' 11"  (1.499 m)     Weight: 50.1 kg (110 lb 7.2 oz)     SpO2:  97% 98% 95%    Intake/Output Summary (Last 24 hours) at 12/04/15 1448 Last data filed at 12/04/15 1400  Gross per 24 hour  Intake    525 ml  Output    301 ml  Net    224 ml   Filed Weights   12/03/15 1800  Weight: 50.1 kg (110 lb 7.2 oz)   Body mass index is 22.3 kg/(m^2).  Exam:   General:  Adult female, No acute distress, pleasantly confused but able to answer direct questions  HEENT:  NCAT, MMM  Cardiovascular:  RRR, warm extremities  Respiratory:  CTAB, no increased WOB  Abdomen:   NABS, soft, NT/ND  MSK:   Normal tone and bulk, no LEE  Neuro:  Grossly moves all extremities  Data Reviewed: Basic Metabolic Panel:  Recent Labs Lab 12/03/15 1332 12/04/15 0455  NA 141 142  K 4.3 4.2  CL 107 108  CO2 22 22  GLUCOSE 143* 98  BUN 41* 40*  CREATININE 2.09* 1.95*  CALCIUM 9.2 9.0   Liver  Function Tests: No results for input(s): AST, ALT, ALKPHOS, BILITOT, PROT, ALBUMIN in the last 168 hours. No results for input(s): LIPASE, AMYLASE in the last 168 hours. No results for input(s): AMMONIA in the last 168 hours. CBC:  Recent Labs Lab 12/03/15 1332 12/04/15 0455  WBC 10.8* 6.9  HGB 11.6* 11.0*  HCT 35.4* 34.4*  MCV 88.3 92.0  PLT 248 224    Recent Results (from the past 240 hour(s))  Urine culture     Status: None (Preliminary result)   Collection Time: 12/03/15  4:01 PM  Result Value Ref Range Status   Specimen Description URINE, CLEAN CATCH  Final   Special Requests NONE  Final   Culture    Final    >=100,000 COLONIES/mL ESCHERICHIA COLI Performed at [facility]    Report Status PENDING  Incomplete  MRSA PCR Screening     Status: None   Collection Time: 12/04/15  9:11 AM  Result Value Ref Range Status   MRSA by PCR NEGATIVE NEGATIVE Final    Comment:        The GeneXpert MRSA Assay (FDA approved for NASAL specimens only), is one component of a comprehensive MRSA colonization surveillance program. It is not intended to diagnose MRSA infection nor to guide or monitor treatment for MRSA infections.      Studies: Dg Chest 1 View  12/03/2015  CLINICAL DATA:  Altered mental status and weakness. EXAM: CHEST 1 VIEW COMPARISON:  09/22/2014 chest radiograph. FINDINGS: Stable cardiomediastinal silhouette with top-normal heart size. No pneumothorax. No pleural effusion. Lungs appear clear, with no acute consolidative airspace disease and no pulmonary edema. IMPRESSION: No active disease. Electronically Signed   By: Ilona Sorrel M.D.   On: 12/03/2015 16:08   Ct Head Wo Contrast  12/03/2015  CLINICAL DATA:  Altered mental status.  Weakness. EXAM: CT HEAD WITHOUT CONTRAST TECHNIQUE: Contiguous axial images were obtained from the base of the skull through the vertex without intravenous contrast. COMPARISON:  07/24/2015 head CT. FINDINGS: Images are partially motion degraded at the vertex. No evidence of parenchymal hemorrhage or extra-axial fluid collection. No mass lesion, mass effect, or midline shift. No CT evidence of acute infarction. Diffuse cerebral volume loss. Intracranial atherosclerosis. Nonspecific mild stable subcortical and periventricular white matter hypodensity, most in keeping with chronic small vessel ischemic change. Stable tiny lacune in the left thalamus. No ventriculomegaly. Stable small mucous retention cyst versus polyp in the posterior left maxillary sinus. The mastoid air cells are unopacified. No evidence of calvarial fracture. IMPRESSION: 1.  No evidence  of acute intracranial abnormality. 2. Intracranial atherosclerosis, diffuse cerebral volume loss and mild chronic small vessel ischemic white matter change. Stable tiny left thalamic lacune. Electronically Signed   By: Ilona Sorrel M.D.   On: 12/03/2015 16:18    Scheduled Meds: . ciprofloxacin  400 mg Intravenous Q24H  . donepezil  10 mg Oral QHS  . enoxaparin (LOVENOX) injection  30 mg Subcutaneous Q24H  . feeding supplement (ENSURE ENLIVE)  237 mL Oral BID BM  . insulin aspart  0-9 Units Subcutaneous 6 times per day  . sertraline  50 mg Oral Daily   Continuous Infusions: . sodium chloride 75 mL/hr at 12/04/15 0700    Principal Problem:   Weakness Active Problems:   Hypertension   Diabetes mellitus ([place])   Renal cell cancer ([facility])   UTI (lower urinary tract infection)   Acute encephalopathy   Diarrhea   Protein-calorie malnutrition, severe ([place])    Time  spent: 30 min    Leroy Pettway, [facility] Pager [phone]. If 7PM-7AM, please contact night-coverage at [email or web address], password [facility] 12/04/2015, 2:48 PM  LOS: 1 day

## 2015-12-04 NOTE — Care Management Note (Signed)
Case Management Note  Patient Details  Name: Tricia Dean MRN: [id number] Date of Birth: 02-26-1936  Subjective/Objective:     Admitted with AMS, possibly related to UTI               Action/Plan: Discharge planning, spoke with daughter at bedside. Patient confused at baseline. Per daughter patient dresses self but not independently, patient wonders so therefore is not left alone, patient lives with daughter full time. Patient does not use any assistive devices for ambulation, she has raised toilet seat, shower chair, bars in bathroom. Per daughter does not think PT would be of use since patient is ambulatory and likely would not follow directions. Per daughter, hospice has been discussed. Offered to provide list of private duty agency but daughter states other family members are able to assist.   Expected Discharge Date:  12/07/15               Expected Discharge Plan:  Home/Self Care  In-House Referral:  NA  Discharge planning Services  CM Consult  Post Acute Care Choice:  NA Choice offered to:  Adult Children, Patient  DME Arranged:  N/A DME Agency:  NA  HH Arranged:  NA HH Agency:  NA  Status of Service:  Completed, signed off  Medicare Important Message Given:    Date Medicare IM Given:    Medicare IM give by:    Date Additional Medicare IM Given:    Additional Medicare Important Message give by:     If discussed at [place] of Stay Meetings, dates discussed:    Additional Comments:  Guadalupe Maple, RN 12/04/2015, 3:35 PM

## 2015-12-05 DIAGNOSIS — G9340 Encephalopathy, unspecified: Secondary | ICD-10-CM

## 2015-12-05 DIAGNOSIS — E08638 Diabetes mellitus due to underlying condition with other oral complications: Secondary | ICD-10-CM

## 2015-12-05 DIAGNOSIS — E43 Unspecified severe protein-calorie malnutrition: Secondary | ICD-10-CM

## 2015-12-05 DIAGNOSIS — N184 Chronic kidney disease, stage 4 (severe): Secondary | ICD-10-CM

## 2015-12-05 DIAGNOSIS — R197 Diarrhea, unspecified: Secondary | ICD-10-CM

## 2015-12-05 DIAGNOSIS — R531 Weakness: Secondary | ICD-10-CM

## 2015-12-05 DIAGNOSIS — Z515 Encounter for palliative care: Secondary | ICD-10-CM

## 2015-12-05 DIAGNOSIS — N390 Urinary tract infection, site not specified: Principal | ICD-10-CM

## 2015-12-05 DIAGNOSIS — C649 Malignant neoplasm of unspecified kidney, except renal pelvis: Secondary | ICD-10-CM

## 2015-12-05 DIAGNOSIS — C7800 Secondary malignant neoplasm of unspecified lung: Secondary | ICD-10-CM

## 2015-12-05 LAB — URINE CULTURE: Culture: >=100000

## 2015-12-05 LAB — HEMOGLOBIN A1C
HEMOGLOBIN A1C: 5.7 % — AB (ref 4.8–5.6)
Mean Plasma Glucose: 117 mg/dL

## 2015-12-05 LAB — GLUCOSE, CAPILLARY
GLUCOSE-CAPILLARY: 111 mg/dL — AB (ref 65–99)
Glucose-Capillary: 160 mg/dL — ABNORMAL HIGH (ref 65–99)
Glucose-Capillary: 115 mg/dL — ABNORMAL HIGH (ref 65–99)
Glucose-Capillary: 139 mg/dL — ABNORMAL HIGH (ref 65–99)

## 2015-12-05 MED ORDER — MEGESTROL ACETATE 400 MG/10ML PO SUSP
400.0000 mg | Freq: Every day | ORAL | Status: DC
  Administered 2015-12-05 – 2015-12-06 (×2): 400 mg via ORAL
  Filled 2015-12-05 (×2): qty 10

## 2015-12-05 MED ORDER — CIPROFLOXACIN HCL 500 MG PO TABS
500.0000 mg | ORAL_TABLET | ORAL | Status: DC
  Administered 2015-12-05: 500 mg via ORAL
  Filled 2015-12-05 (×2): qty 1

## 2015-12-05 MED ORDER — SACCHAROMYCES BOULARDII 250 MG PO CAPS
250.0000 mg | ORAL_CAPSULE | Freq: Two times a day (BID) | ORAL | Status: DC
  Administered 2015-12-05 – 2015-12-06 (×2): 250 mg via ORAL
  Filled 2015-12-05 (×3): qty 1

## 2015-12-05 MED ORDER — HYDROCODONE-ACETAMINOPHEN 5-325 MG PO TABS: 1.0000 | ORAL_TABLET | Freq: Four times a day (QID) | ORAL | Status: DC | PRN

## 2015-12-05 NOTE — Progress Notes (Signed)
[facility] Progress Note  Patient: Tricia Dean [id number]   PCP: Donnie Coffin, MD DOB: [birth year]   DOA: 12/03/2015   DOS: 12/05/2015   Date of Service: the patient was seen and examined on 12/05/2015  Subjective: The patient's daughter is concerned about patient having diarrhea although no bowel movement has been documented throughout the day. He should denies any acute complaint. No nausea no vomiting. Daughter also complains of lack of appetite for the patient. Nutrition: Tolerating oral diet as per nursing Activity: Ambulating in the room Last BM: 12/03/2015  Assessment and Plan: 1. Weakness  Acute encephalopathy, UTI with Escherichia coli The patient is presenting with generalized weakness. This is most likely secondary to malignancy as well as progressive dementia. Urine is growing Escherichia coli which is sensitive to ciprofloxacin. We will continue treatment to complete 5-7 day treatment course. At present patient appears to be significantly better.  2. Metastatic renal cell carcinoma with metastasis to lung. Generalized weakness Anorexia. Patient in the past has refused any diagnostic or therapeutic approach for her renal cell carcinoma. Consulted palliative care who recommended home with hospice option for the patient wished the patient and the daughter are agreeing upon. Facilities has been arranged.  3. diarrhea. Daughter mentions the patient has been having diarrhea for last 2 weeks. Patient does not have any loose stool that can be sent for workup since her admission. Urine today the patient had a minimal bowel movement not amounting to diarrhea. Recommend the patient to start taking probiotics.  4. Anorexia. Most likely this is related with renal cell carcinoma. Start the patient on Megace.  5. Protein calorie now nutrition. Continue nutritional supplementation.  6. Chronic kidney disease stage IV. Renal function stable avoid nephrotoxic  medications.   DVT Prophylaxis: subcutaneous Heparin Nutrition: Regular diet Advance goals of care discussion: DNR/DNI  Brief Summary of Hospitalization:  HPI: As per the H and P dictated on admission, "Tricia Dean is a 80 y.o. female with a history of Untreated Renal Cell Carcinoma diagnosed in 2011 who presents to the ED with complaints of increased weakness , confusion, and worsening diarrhea and incontinence over the past week. She has had worsening of diarrhea over 3 months. She denies any fevers or chills. She has had poor appetite and overall decline over the past month. She was evaluated in the ED and found to have a UTI, and a Urine C+S was sent and she was placed on IV Cipro. A C.diff PCR was also sent and she was placed on Enteric Precautions. Code Status has been discussed and patient decided to be a DoNOT Resuscitate, and wishes to have a Palliative care consultation while hospitalized. Her daughter is at the bedside and assists with the history. " Daily update, Procedures: none Consultants: none Antibiotics: Anti-infectives    Start     Dose/Rate Route Frequency Ordered Stop   12/05/15 1800  ciprofloxacin (CIPRO) tablet 500 mg     500 mg Oral Every 24 hours 12/05/15 1710     12/04/15 1600  ciprofloxacin (CIPRO) IVPB 400 mg  Status:  Discontinued     400 mg 200 mL/hr over 60 Minutes Intravenous Every 24 hours 12/03/15 1737 12/05/15 1709   12/03/15 1545  ciprofloxacin (CIPRO) IVPB 400 mg     400 mg 200 mL/hr over 60 Minutes Intravenous  Once 12/03/15 1536 12/03/15 1724       Family Communication: family was present at bedside, at the time of interview.  Opportunity was given to  ask question and all questions were answered satisfactorily.   Disposition:  Expected discharge date:12/06/15 Barriers to safe discharge: diarrhea   Intake/Output Summary (Last 24 hours) at 12/05/15 1849 Last data filed at 12/05/15 1801  Gross per 24 hour  Intake    840 ml   Output    300 ml  Net    540 ml   Filed Weights   12/03/15 1800  Weight: 50.1 kg (110 lb 7.2 oz)    Objective: Physical Exam: Filed Vitals:   12/05/15 0610 12/05/15 1007 12/05/15 1406 12/05/15 1801  BP: 158/66 173/71 120/90 139/56  Pulse: 80 52 71 62  Temp: 98.7 F (37.1 C) 99.1 F (37.3 C) 99.3 F (37.4 C) 99.3 F (37.4 C)  TempSrc: Oral Oral Oral Oral  Resp: 16 16 16 16   Height:      Weight:      SpO2: 100% 100% 98% 100%     General: Appear in no distress, no Rash; Oral Mucosa moist. Cardiovascular: S1 and S2 Present, no Murmur, no JVD Respiratory: Bilateral Air entry present and Clear to Auscultation, no Crackles, no wheezes Abdomen: Bowel Sound present, Soft and no tenderness Extremities: no Pedal edema, no calf tenderness Neurology: Grossly no focal neuro deficit.  Data Reviewed: CBC:  Recent Labs Lab 12/03/15 1332 12/04/15 0455  WBC 10.8* 6.9  HGB 11.6* 11.0*  HCT 35.4* 34.4*  MCV 88.3 92.0  PLT 248 [id number]   Basic Metabolic Panel:  Recent Labs Lab 12/03/15 1332 12/04/15 0455  NA 141 142  K 4.3 4.2  CL 107 108  CO2 22 22  GLUCOSE 143* 98  BUN 41* 40*  CREATININE 2.09* 1.95*  CALCIUM 9.2 9.0   Liver Function Tests: No results for input(s): AST, ALT, ALKPHOS, BILITOT, PROT, ALBUMIN in the last 168 hours. No results for input(s): LIPASE, AMYLASE in the last 168 hours. No results for input(s): AMMONIA in the last 168 hours.  Cardiac Enzymes: No results for input(s): CKTOTAL, CKMB, CKMBINDEX, TROPONINI in the last 168 hours.  BNP (last 3 results) No results for input(s): BNP in the last 8760 hours.  CBG:  Recent Labs Lab 12/04/15 1215 12/04/15 1649 12/05/15 0811 12/05/15 1230 12/05/15 1643  GLUCAP 109* 81 111* 160* 115*    Recent Results (from the past 240 hour(s))  Urine culture     Status: None   Collection Time: 12/03/15  4:01 PM  Result Value Ref Range Status   Specimen Description URINE, CLEAN CATCH  Final   Special Requests  NONE  Final   Culture   Final    >=100,000 COLONIES/mL ESCHERICHIA COLI Performed at [facility]    Report Status 12/05/2015 FINAL  Final   Organism ID, Bacteria ESCHERICHIA COLI  Final      Susceptibility   Escherichia coli - MIC*    AMPICILLIN >=32 RESISTANT Resistant     CEFAZOLIN 8 SENSITIVE Sensitive     CEFTRIAXONE <=1 SENSITIVE Sensitive     CIPROFLOXACIN <=0.25 SENSITIVE Sensitive     GENTAMICIN <=1 SENSITIVE Sensitive     IMIPENEM <=0.25 SENSITIVE Sensitive     NITROFURANTOIN <=16 SENSITIVE Sensitive     TRIMETH/SULFA <=20 SENSITIVE Sensitive     AMPICILLIN/SULBACTAM >=32 RESISTANT Resistant     PIP/TAZO <=4 SENSITIVE Sensitive     * >=100,000 COLONIES/mL ESCHERICHIA COLI  MRSA PCR Screening     Status: None   Collection Time: 12/04/15  9:11 AM  Result Value Ref Range Status   MRSA by  PCR NEGATIVE NEGATIVE Final    Comment:        The GeneXpert MRSA Assay (FDA approved for NASAL specimens only), is one component of a comprehensive MRSA colonization surveillance program. It is not intended to diagnose MRSA infection nor to guide or monitor treatment for MRSA infections.      Studies: No results found.   Scheduled Meds: . ciprofloxacin  500 mg Oral Q24H  . donepezil  10 mg Oral QHS  . enoxaparin (LOVENOX) injection  30 mg Subcutaneous Q24H  . feeding supplement  1 Container Oral TID BM  . feeding supplement (ENSURE ENLIVE)  237 mL Oral BID BM  . insulin aspart  0-9 Units Subcutaneous TID WC  . megestrol  400 mg Oral Daily  . saccharomyces boulardii  250 mg Oral BID  . sertraline  50 mg Oral Daily   Continuous Infusions: . sodium chloride 75 mL/hr at 12/04/15 0700   PRN Meds: acetaminophen **OR** acetaminophen, alum & mag hydroxide-simeth, HYDROcodone-acetaminophen, ondansetron **OR** ondansetron (ZOFRAN) IV  Time spent: 30 minutes  Author: Berle Mull, MD [facility] Pager: [phone] 12/05/2015 6:49 PM  If 7PM-7AM, please contact  night-coverage at [email or web address], password [facility]

## 2015-12-05 NOTE — Progress Notes (Signed)
Pharmacy Antibiotic Note  Tricia Dean is a 80 y.o. female admitted on 12/03/2015 with UTI.  Pharmacy has been consulted for Oral Cipro dosing.  Plan:  Change to Cipro 500mg  PO q24h  Consider narrowing to Keflex 250mg  PO q12h.  Noted PCN allergy, but pt tolerates Unasyn per allergy comments.   Dosage remains stable and need for further dosage adjustment appears unlikely at present.  Pharmacy will sign off at this time.  Please reconsult if a change in clinical status warrants re-evaluation of dosage.   Height: 4\' 11"  (149.9 cm) Weight: 110 lb 7.2 oz (50.1 kg) IBW/kg (Calculated) : 43.2  Temp (24hrs), Avg:98.8 F (37.1 C), Min:98.4 F (36.9 C), Max:99.3 F (37.4 C)   Recent Labs Lab 12/03/15 1332 12/04/15 0455  WBC 10.8* 6.9  CREATININE 2.09* 1.95*    Estimated Creatinine Clearance: 16.5 mL/min (by C-G formula based on Cr of 1.95).    Allergies  Allergen Reactions  . Other     "miacin"?  . Penicillins Other (See Comments)    Tolerates Unasyn  Has patient had a PCN reaction causing immediate rash, facial/tongue/throat swelling, SOB or lightheadedness with hypotension: yes Has patient had a PCN reaction causing severe rash involving mucus membranes or skin necrosis: no Has patient had a PCN reaction occurring within the last 10 years: no If all of the above answers are "NO", then may proceed with Cephalosporin use.     Antimicrobials this admission: 2/13 >> Cipro >>  Dose adjustments this admission:  Microbiology results: 2/13 UCx: > 100k Ecoli (pan-sensitive except ampicillin, amp/sulbactam) 2/14 MRSA PCR: negative  Thank you for allowing pharmacy to be a part of this patient's care.  Gretta Arab PharmD, BCPS Pager [phone] 12/05/2015 4:54 PM

## 2015-12-05 NOTE — Progress Notes (Signed)
Spoke with patient and daughter at bedside. Daughter has chosen [facility] for hospice services. Contacted them for referral. Faxed clinicals to [facility] at [phone].

## 2015-12-05 NOTE — Progress Notes (Signed)
Patient had no bowel movements from 7am to 7 pm 12/05/15.  Stool unable to be collected

## 2015-12-06 DIAGNOSIS — I10 Essential (primary) hypertension: Secondary | ICD-10-CM

## 2015-12-06 DIAGNOSIS — Z7189 Other specified counseling: Secondary | ICD-10-CM

## 2015-12-06 LAB — BASIC METABOLIC PANEL
ANION GAP: 9 (ref 5–15)
BUN: 35 mg/dL — AB (ref 6–20)
CO2: 22 mmol/L (ref 22–32)
Calcium: 8.3 mg/dL — ABNORMAL LOW (ref 8.9–10.3)
Chloride: 107 mmol/L (ref 101–111)
Creatinine, Ser: 1.55 mg/dL — ABNORMAL HIGH (ref 0.44–1.00)
GFR, EST AFRICAN AMERICAN: 36 mL/min — AB (ref >60)
GFR, EST NON AFRICAN AMERICAN: 31 mL/min — AB (ref >60)
Glucose, Bld: 105 mg/dL — ABNORMAL HIGH (ref 65–99)
POTASSIUM: 3.3 mmol/L — AB (ref 3.5–5.1)
SODIUM: 138 mmol/L (ref 135–145)

## 2015-12-06 LAB — GLUCOSE, CAPILLARY
GLUCOSE-CAPILLARY: 99 mg/dL (ref 65–99)
GLUCOSE-CAPILLARY: 150 mg/dL — AB (ref 65–99)

## 2015-12-06 MED ORDER — SACCHAROMYCES BOULARDII 250 MG PO CAPS: 250.0000 mg | ORAL_CAPSULE | Freq: Two times a day (BID) | ORAL | Status: AC

## 2015-12-06 MED ORDER — CIPROFLOXACIN HCL 500 MG PO TABS: 500.0000 mg | ORAL_TABLET | ORAL | Status: AC

## 2015-12-06 MED ORDER — MEGESTROL ACETATE 400 MG/10ML PO SUSP: 400.0000 mg | Freq: Every day | ORAL | Status: AC

## 2015-12-06 MED ORDER — ALUM & MAG HYDROXIDE-SIMETH 200-200-20 MG/5ML PO SUSP
30.0000 mL | Freq: Four times a day (QID) | ORAL | Status: AC | PRN
Start: 2015-12-06 — End: ?

## 2015-12-06 MED ORDER — ENSURE ENLIVE PO LIQD: 237.0000 mL | Freq: Three times a day (TID) | ORAL | Status: AC

## 2015-12-06 NOTE — Progress Notes (Signed)
The patient has not had any bowel movements from 7p-7a.

## 2015-12-06 NOTE — Discharge Summary (Addendum)
[facility] Discharge Summary   Patient: Tricia Dean [id number]   PCP: Donnie Coffin, MD DOB: 1936-09-02   Date of admission: 12/03/2015   Date of discharge: 12/06/2015    Discharge Diagnoses:  Principal Problem:   Weakness Active Problems:   Hypertension   Diabetes mellitus ([place])   Renal cell cancer ([place])   UTI (lower urinary tract infection)   Acute encephalopathy   Diarrhea   Protein-calorie malnutrition, severe (H[facility])   Malignant neoplasm metastatic to lung [facility])   Encounter for palliative care   Goals of care, counseling/discussion   Chronic kidney disease, stage IV (severe) ([place])   Recommendations for Outpatient Follow-up:  1. Please follow up with PCP in 1 week  Follow-up Information    Follow up with [facility].   Specialty:  Home Health Services   Why:  home hospice services   Contact information:   [place] [address] [phone]       Follow up with Donnie Coffin, MD. Schedule an appointment as soon as possible for a visit in 1 week.   Specialty:  Family Medicine   Contact information:   [address] [place] [address] [phone]       Diet recommendation: regular diet  Activity: The patient is advised to gradually reintroduce usual activities.  Discharge Condition: good  History of present illness: As per the H and P dictated on admission, "Tricia Dean is a 80 y.o. female with a history of Untreated Renal Cell Carcinoma diagnosed in 2011 who presents to the ED with complaints of increased weakness , confusion, and worsening diarrhea and incontinence over the past week. She has had worsening of diarrhea over 3 months. She denies any fevers or chills. She has had poor appetite and overall decline over the past month. She was evaluated in the ED and found to have a UTI, and a Urine C+S was sent and she was placed on IV Cipro. A C.diff PCR was also sent and she was placed on Enteric  Precautions. Code Status has been discussed and patient decided to be a DoNOT Resuscitate, and wishes to have a Palliative care consultation while hospitalized. Her daughter is at the bedside and assists with the history."  Hospital Course:  Summary of her active problems in the hospital is as following. 1. Weakness Acute encephalopathy, UTI with Escherichia coli The patient is presenting with generalized weakness. This is most likely secondary to malignancy as well as progressive dementia. Urine is growing Escherichia coli which is sensitive to ciprofloxacin. We will continue treatment to complete 7 day treatment course. At present patient appears to be significantly better.  2. Metastatic renal cell carcinoma with metastasis to lung. Generalized weakness Anorexia. Patient in the past has refused any diagnostic or therapeutic approach for her renal cell carcinoma. Consulted palliative care who recommended home with hospice option for the patient which the patient and the daughter are agreeing upon. Facilities has been arranged.  3. Diarrhea. Daughter mentions the patient has been having diarrhea for last 2 weeks. Patient does not have any loose stool that can be sent for workup since her admission. patient had a minimal bowel movement not amounting to diarrhea. Recommend the patient to start taking probiotics.  4. Protein calorie now nutrition. Anorexia Likely due to malignancy, add megace, Continue nutritional supplementation.  6. Chronic kidney disease stage IV. Renal function stable avoid nephrotoxic medications.   All other chronic medical condition were stable during  the hospitalization.  Patient was ambulatory without any assistance. Palliative care recommended hospice for patient and family requested the same and it was arranged in discharged. On the day of the discharge the patient's vitals remained stable and she did not have diarrhea for 24 hours, and no other acute  medical condition were reported by patient. the patient was felt safe to be discharge at home with hospice.  Procedures and Results:  none   Consultations:  Palliative care.  DISCHARGE MEDICATION: Discharge Medication List as of 12/06/2015 12:44 PM    START taking these medications   Details  alum & mag hydroxide-simeth (MAALOX/MYLANTA) 200-200-20 MG/5ML suspension Take 30 mLs by mouth every 6 (six) hours as needed for indigestion or heartburn (dyspepsia)., Starting 12/06/2015, Until Discontinued, Normal    ciprofloxacin (CIPRO) 500 MG tablet Take 1 tablet (500 mg total) by mouth daily., Starting 12/06/2015, Until Sun 12/09/15, Normal    feeding supplement, ENSURE ENLIVE, (ENSURE ENLIVE) LIQD Take 237 mLs by mouth 3 (three) times daily between meals., Starting 12/06/2015, Until Discontinued, Normal    megestrol (MEGACE) 400 MG/10ML suspension Take 10 mLs (400 mg total) by mouth daily., Starting 12/06/2015, Until Discontinued, Normal    saccharomyces boulardii (FLORASTOR) 250 MG capsule Take 1 capsule (250 mg total) by mouth 2 (two) times daily., Starting 12/06/2015, Until Discontinued, Normal      CONTINUE these medications which have NOT CHANGED   Details  BAYER MICROLET LANCETS lancets Historical Med    donepezil (ARICEPT) 10 MG tablet Take 10 mg by mouth at bedtime., Until Discontinued, Historical Med    HYDROcodone-acetaminophen (NORCO/VICODIN) 5-325 MG tablet Take 1 tablet by mouth every 6 (six) hours as needed for moderate pain or severe pain. for pain, Starting 11/10/2015, Until Discontinued, Historical Med    ONE TOUCH ULTRA TEST test strip Historical Med    sertraline (ZOLOFT) 50 MG tablet Take 50 mg by mouth daily., Until Discontinued, Historical Med    Chlorhexidine Gluconate 2 % LIQD Use this for bathing from the neck down. After getting wet apply from the neck down and leave on for 2 minutes before rinsing off. Do this for 1 month., No Print    mupirocin ointment  (BACTROBAN) 2 % Place 1 application into the nose 2 (two) times daily., Starting 09/16/2014, Until Discontinued, Print      STOP taking these medications     omeprazole (PRILOSEC) 40 MG capsule        Allergies  Allergen Reactions  . Other     "miacin"?  . Penicillins Other (See Comments)    Tolerates Unasyn  Has patient had a PCN reaction causing immediate rash, facial/tongue/throat swelling, SOB or lightheadedness with hypotension: yes Has patient had a PCN reaction causing severe rash involving mucus membranes or skin necrosis: no Has patient had a PCN reaction occurring within the last 10 years: no If all of the above answers are "NO", then may proceed with Cephalosporin use.    Discharge Instructions    Diet general    Complete by:  As directed      Discharge instructions    Complete by:  As directed   It is important that you read following instructions as well as go over your medication list with RN to help you understand your care after this hospitalization.  Discharge Instructions: Please follow-up with PCP in one week  Please request your primary care physician to go over all Hospital Tests and Procedure/Radiological results at the follow up,  Please get  all Hospital records sent to your PCP by signing hospital release before you go home.   Do not take more than prescribed Pain, Sleep and Anxiety Medications. You were cared for by a hospitalist during your hospital stay. If you have any questions about your discharge medications or the care you received while you were in the hospital after you are discharged, you can call the unit and ask to speak with the hospitalist on call if the hospitalist that took care of you is not available.  Once you are discharged, your primary care physician will handle any further medical issues. Please note that NO REFILLS for any discharge medications will be authorized once you are discharged, as it is imperative that you return to your  primary care physician (or establish a relationship with a primary care physician if you do not have one) for your aftercare needs so that they can reassess your need for medications and monitor your lab values. You Must read complete instructions/literature along with all the possible adverse reactions/side effects for all the Medicines you take and that have been prescribed to you. Take any new Medicines after you have completely understood and accept all the possible adverse reactions/side effects. Wear Seat belts while driving.     Increase activity slowly    Complete by:  As directed           Discharge Exam: Filed Weights   12/03/15 1800  Weight: 50.1 kg (110 lb 7.2 oz)   Filed Vitals:   12/05/15 2148 12/06/15 0550  BP: 109/69 107/68  Pulse: 70 72  Temp: 98.9 F (37.2 C) 98.8 F (37.1 C)  Resp: 16 16   General: Appear in mild distress, no Rash; Oral Mucosa moist. Cardiovascular: S1 and S2 Present, no Murmur, no JVD Respiratory: Bilateral Air entry present and Clear to Auscultation, no Crackles, on wheezes Abdomen: Bowel Sound present, Soft and no tenderness Extremities: no Pedal edema, no calf tenderness Neurology: Grossly no focal neuro deficit.  The results of significant diagnostics from this hospitalization (including imaging, microbiology, ancillary and laboratory) are listed below for reference.    Significant Diagnostic Studies: Dg Chest 1 View  12/03/2015  CLINICAL DATA:  Altered mental status and weakness. EXAM: CHEST 1 VIEW COMPARISON:  09/22/2014 chest radiograph. FINDINGS: Stable cardiomediastinal silhouette with top-normal heart size. No pneumothorax. No pleural effusion. Lungs appear clear, with no acute consolidative airspace disease and no pulmonary edema. IMPRESSION: No active disease. Electronically Signed   By: Ilona Sorrel M.D.   On: 12/03/2015 16:08   Ct Head Wo Contrast  12/03/2015  CLINICAL DATA:  Altered mental status.  Weakness. EXAM: CT HEAD  WITHOUT CONTRAST TECHNIQUE: Contiguous axial images were obtained from the base of the skull through the vertex without intravenous contrast. COMPARISON:  07/24/2015 head CT. FINDINGS: Images are partially motion degraded at the vertex. No evidence of parenchymal hemorrhage or extra-axial fluid collection. No mass lesion, mass effect, or midline shift. No CT evidence of acute infarction. Diffuse cerebral volume loss. Intracranial atherosclerosis. Nonspecific mild stable subcortical and periventricular white matter hypodensity, most in keeping with chronic small vessel ischemic change. Stable tiny lacune in the left thalamus. No ventriculomegaly. Stable small mucous retention cyst versus polyp in the posterior left maxillary sinus. The mastoid air cells are unopacified. No evidence of calvarial fracture. IMPRESSION: 1.  No evidence of acute intracranial abnormality. 2. Intracranial atherosclerosis, diffuse cerebral volume loss and mild chronic small vessel ischemic white matter change. Stable tiny left thalamic lacune.  Electronically Signed   By: Ilona Sorrel M.D.   On: 12/03/2015 16:18    Microbiology: Recent Results (from the past 240 hour(s))  Urine culture     Status: None   Collection Time: 12/03/15  4:01 PM  Result Value Ref Range Status   Specimen Description URINE, CLEAN CATCH  Final   Special Requests NONE  Final   Culture   Final    >=100,000 COLONIES/mL ESCHERICHIA COLI Performed at [facility]    Report Status 12/05/2015 FINAL  Final   Organism ID, Bacteria ESCHERICHIA COLI  Final      Susceptibility   Escherichia coli - MIC*    AMPICILLIN >=32 RESISTANT Resistant     CEFAZOLIN 8 SENSITIVE Sensitive     CEFTRIAXONE <=1 SENSITIVE Sensitive     CIPROFLOXACIN <=0.25 SENSITIVE Sensitive     GENTAMICIN <=1 SENSITIVE Sensitive     IMIPENEM <=0.25 SENSITIVE Sensitive     NITROFURANTOIN <=16 SENSITIVE Sensitive     TRIMETH/SULFA <=20 SENSITIVE Sensitive     AMPICILLIN/SULBACTAM  >=32 RESISTANT Resistant     PIP/TAZO <=4 SENSITIVE Sensitive     * >=100,000 COLONIES/mL ESCHERICHIA COLI  MRSA PCR Screening     Status: None   Collection Time: 12/04/15  9:11 AM  Result Value Ref Range Status   MRSA by PCR NEGATIVE NEGATIVE Final    Comment:        The GeneXpert MRSA Assay (FDA approved for NASAL specimens only), is one component of a comprehensive MRSA colonization surveillance program. It is not intended to diagnose MRSA infection nor to guide or monitor treatment for MRSA infections.      Labs: CBC:  Recent Labs Lab 12/03/15 1332 12/04/15 0455  WBC 10.8* 6.9  HGB 11.6* 11.0*  HCT 35.4* 34.4*  MCV 88.3 92.0  PLT 248 [id number]   Basic Metabolic Panel:  Recent Labs Lab 12/03/15 1332 12/04/15 0455 12/06/15 0429  NA 141 142 138  K 4.3 4.2 3.3*  CL 107 108 107  CO2 22 22 22   GLUCOSE 143* 98 105*  BUN 41* 40* 35*  CREATININE 2.09* 1.95* 1.55*  CALCIUM 9.2 9.0 8.3*   CBG:  Recent Labs Lab 12/05/15 1230 12/05/15 1643 12/05/15 2211 12/06/15 0822 12/06/15 1158  GLUCAP 160* 115* 139* 99 150*   Time spent: 30 minutes  Signed:  Edward Trevino  [facility] 12/06/2015, 9:22 PM

## 2015-12-06 NOTE — Plan of Care (Signed)
Problem: Education: Goal: Knowledge of [facility] Education information/materials will improve Outcome: Not Met (add Reason) Due to dementia

## 2015-12-06 NOTE — Progress Notes (Signed)
Patient alert and oriented to self and situation. Patient caregiver given discharge instructions and verbalized understanding of instructions. All questions answered.

## 2016-01-19 DEATH — deceased

## 2016-04-27 IMAGING — CT CT HEAD W/O CM
3 of 4 series · 17 of 30 positions shown, 19 images · non-contrast
Comparison: 07/24/2015 head CT.

CLINICAL DATA: Altered mental status.  Weakness.

EXAM:
CT HEAD WITHOUT CONTRAST
TECHNIQUE: Contiguous axial images were obtained from the base of the skull
through the vertex without intravenous contrast.

[Series 2: head w/o · axial · non-contrast · 0.42mm/px · z∈[-58,+42]mm · 6 of 30 slices shown, 8 images]
[im 5/30  brain]
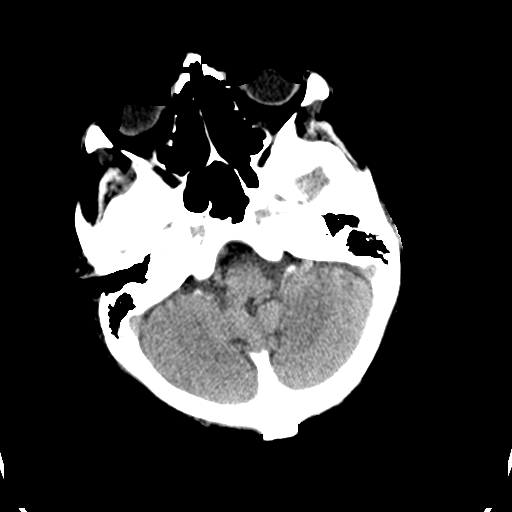
[im 5/30  bone]
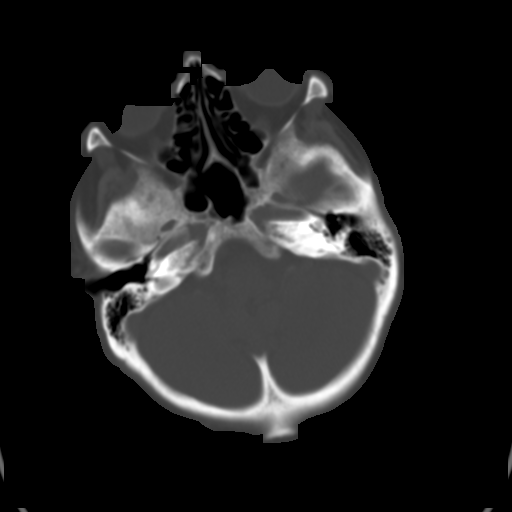
[im 9/30  brain]
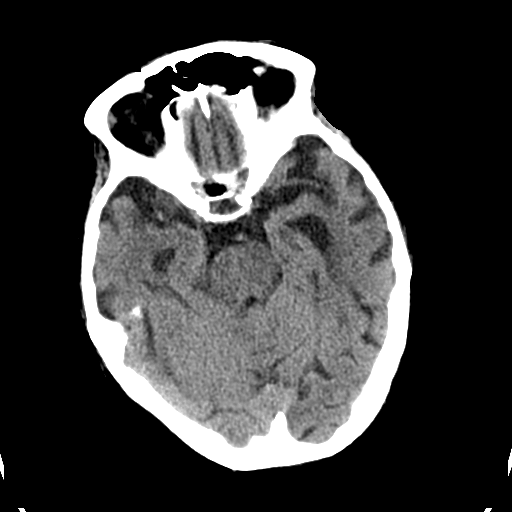
[im 13/30  brain]
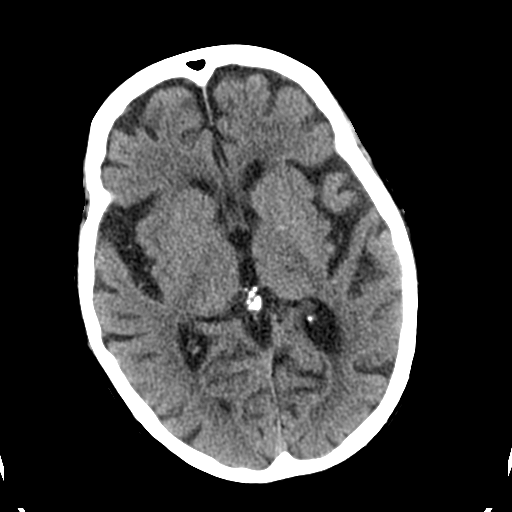
[im 17/30  brain]
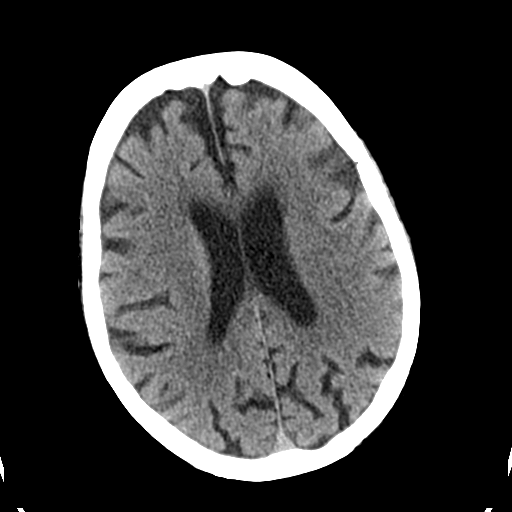
[im 21/30  brain]
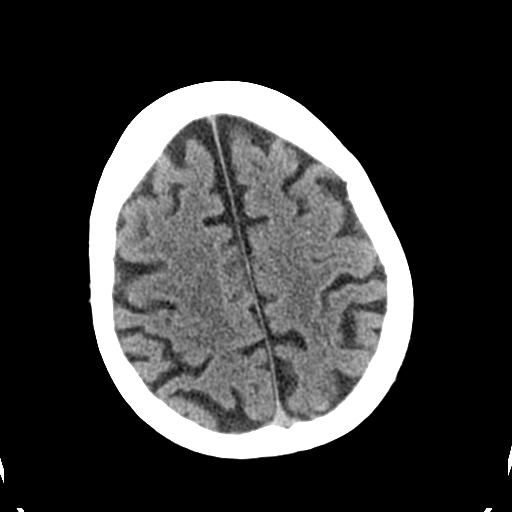
[im 21/30  bone]
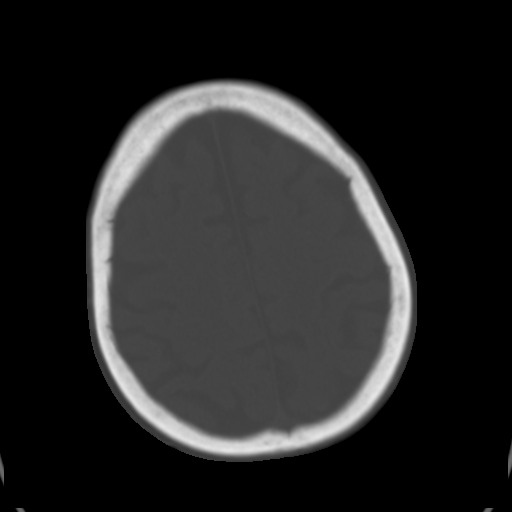
[im 25/30  brain]
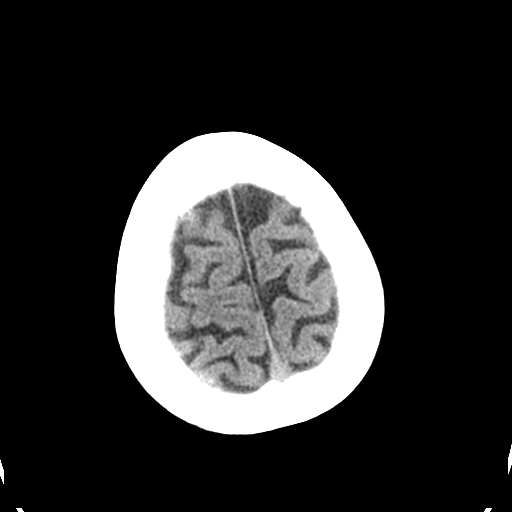

[Series 3: bone windows · axial · 0.42mm/px · z∈[-69,+57]mm · 8 of 50 slices shown (1 of 2)]
[im 4/50  bone]
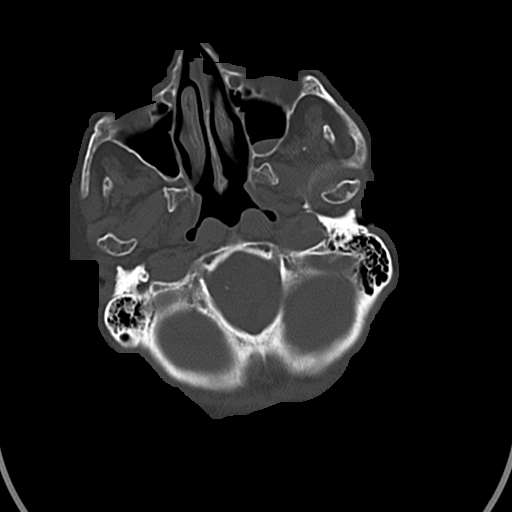
[im 12/50  bone]
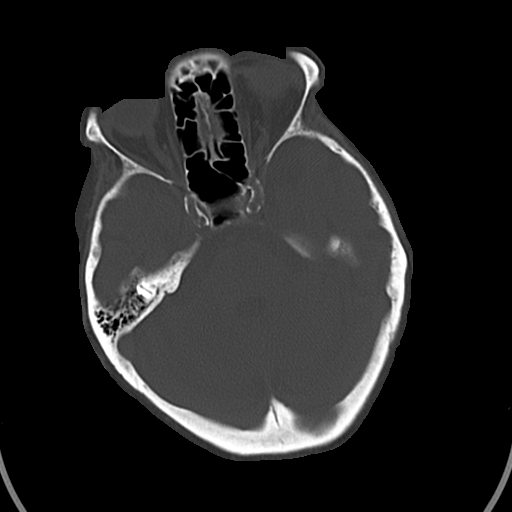
[im 16/50  bone]
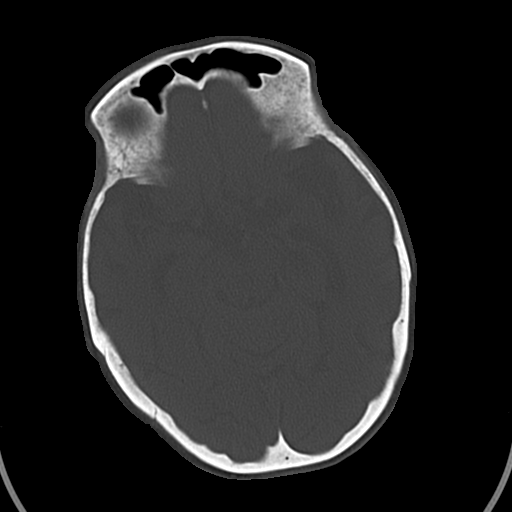
[im 23/50  bone]
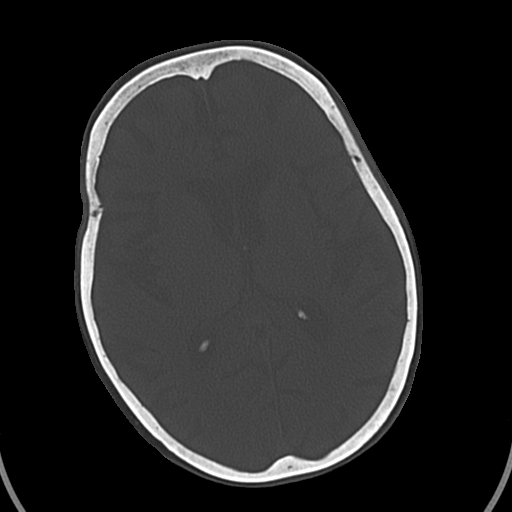
[im 27/50  bone]
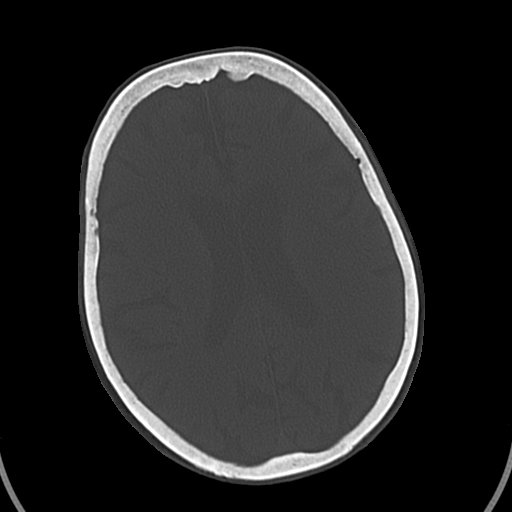
[im 34/50  bone]
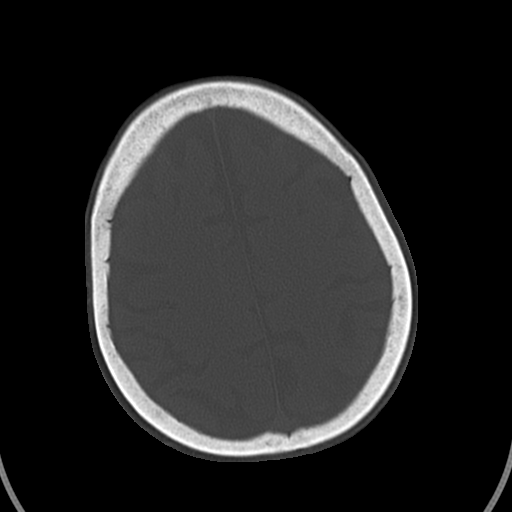
[im 38/50  bone]
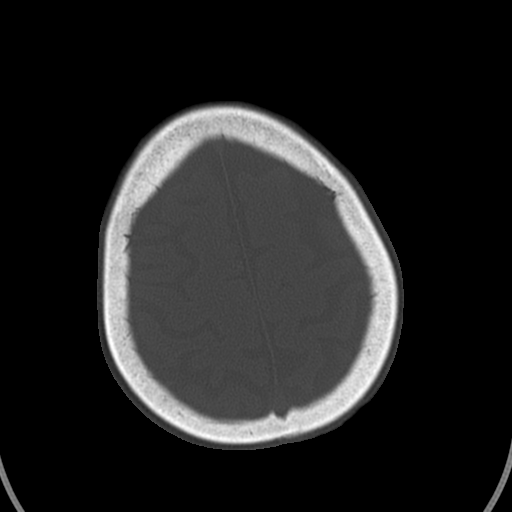
[im 46/50  bone]
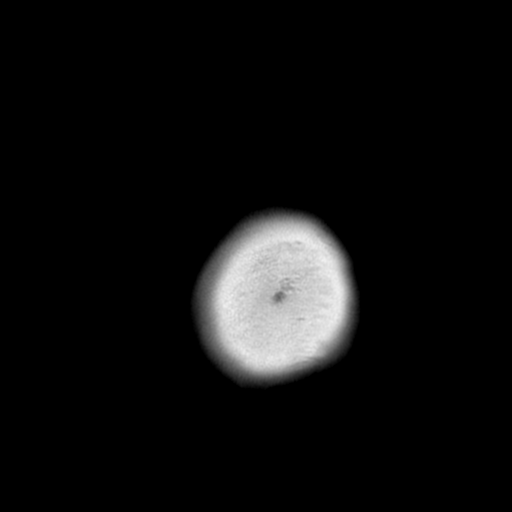

[Series 5: bone windows · axial · 0.42mm/px · z∈[+33,+57]mm · 3 of 16 slices shown (2 of 2)]
[im 4/16  bone]
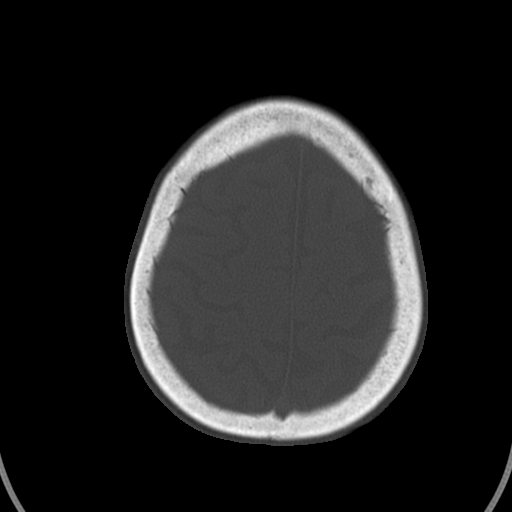
[im 8/16  bone]
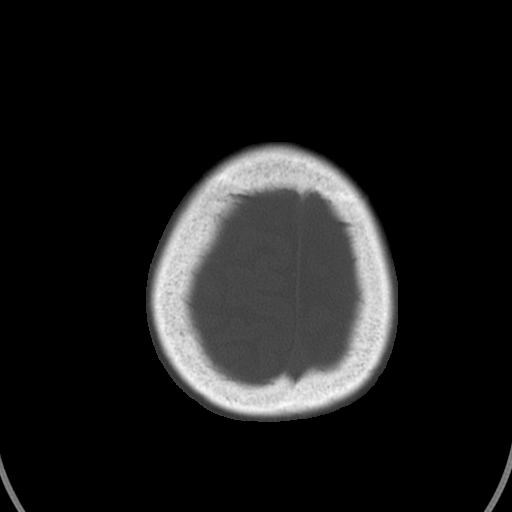
[im 12/16  bone]
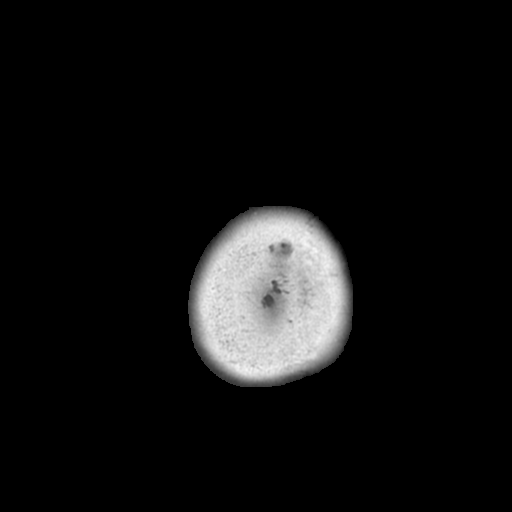

[17 of 30 positions shown; findings below may reference images not displayed]

FINDINGS: Images are partially motion degraded at the vertex. No evidence of
parenchymal hemorrhage or extra-axial fluid collection. No mass
lesion, mass effect, or midline shift.

No CT evidence of acute infarction. Diffuse cerebral volume loss.

Intracranial atherosclerosis. Nonspecific mild stable subcortical
and periventricular white matter hypodensity, most in keeping with
chronic small vessel ischemic change. Stable tiny lacune in the left
thalamus. No ventriculomegaly.

Stable small mucous retention cyst versus polyp in the posterior
left maxillary sinus. The mastoid air cells are unopacified. No
evidence of calvarial fracture.
IMPRESSION: 1.  No evidence of acute intracranial abnormality.
2. Intracranial atherosclerosis, diffuse cerebral volume loss and
mild chronic small vessel ischemic white matter change. Stable tiny
left thalamic lacune.
